# Patient Record
Sex: Male | Born: 1980 | Race: White | Hispanic: No | Marital: Single | Smoking: Current every day smoker
Health system: Southern US, Community
[De-identification: ages and names within clinical notes are randomized; demographics above are authoritative.]

## PROBLEM LIST (undated history)

## (undated) DIAGNOSIS — E0590 Thyrotoxicosis, unspecified without thyrotoxic crisis or storm: Secondary | ICD-10-CM

## (undated) DIAGNOSIS — J189 Pneumonia, unspecified organism: Secondary | ICD-10-CM

## (undated) DIAGNOSIS — M2550 Pain in unspecified joint: Secondary | ICD-10-CM

## (undated) DIAGNOSIS — N179 Acute kidney failure, unspecified: Secondary | ICD-10-CM

## (undated) DIAGNOSIS — Z8614 Personal history of Methicillin resistant Staphylococcus aureus infection: Secondary | ICD-10-CM

## (undated) DIAGNOSIS — Z8709 Personal history of other diseases of the respiratory system: Secondary | ICD-10-CM

## (undated) DIAGNOSIS — G8929 Other chronic pain: Secondary | ICD-10-CM

## (undated) DIAGNOSIS — M549 Dorsalgia, unspecified: Secondary | ICD-10-CM

## (undated) HISTORY — PX: OTHER SURGICAL HISTORY: SHX169

---

## 2002-08-25 DIAGNOSIS — J189 Pneumonia, unspecified organism: Secondary | ICD-10-CM

## 2002-08-25 HISTORY — DX: Pneumonia, unspecified organism: J18.9

## 2006-02-18 ENCOUNTER — Emergency Department (HOSPITAL_COMMUNITY): Admission: EM | Admit: 2006-02-18 | Discharge: 2006-02-18 | Payer: Self-pay | Admitting: Family Medicine

## 2006-07-14 ENCOUNTER — Emergency Department (HOSPITAL_COMMUNITY): Admission: EM | Admit: 2006-07-14 | Discharge: 2006-07-14 | Payer: Self-pay | Admitting: Emergency Medicine

## 2008-01-01 IMAGING — CR DG FOREARM 2V*L*
2 series · 2 of 2 positions shown · non-contrast
Comparison: none

CLINICAL DATA: Fall

LEFT FOREARM - 2 VIEW

[view not recorded (1 of 2)]
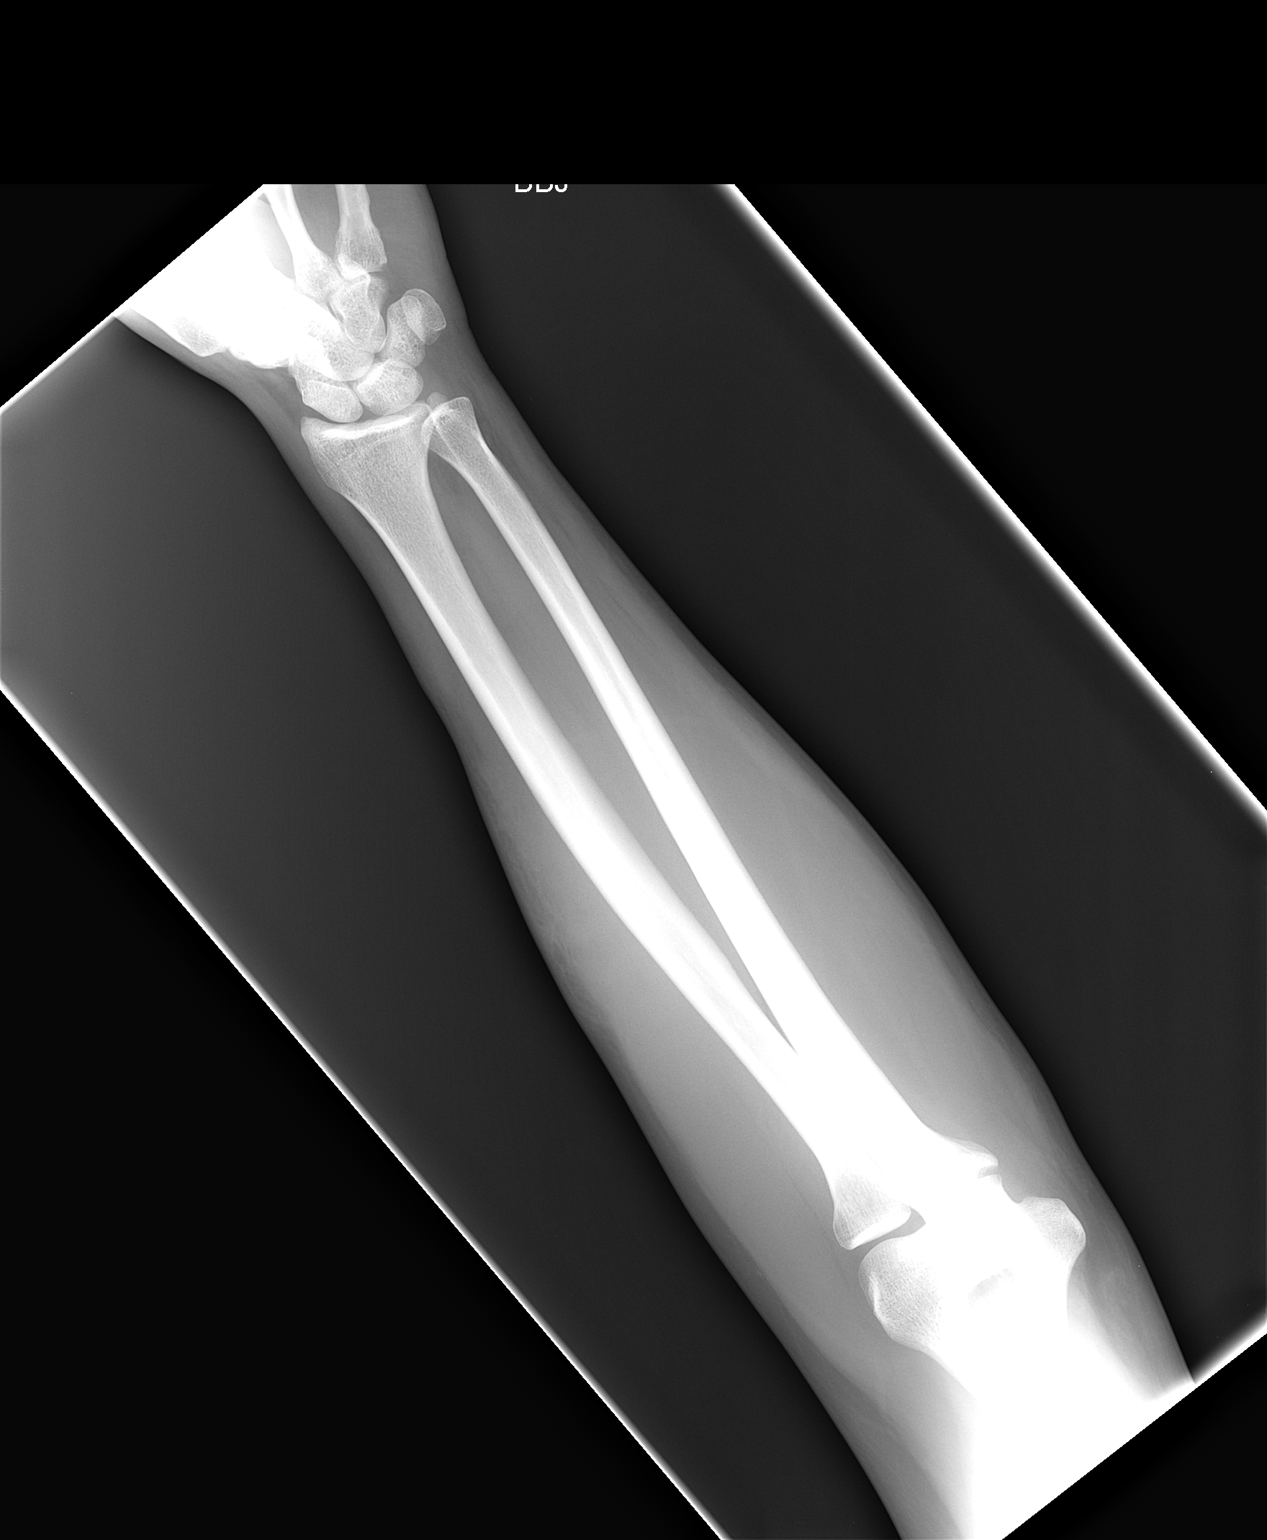

[view not recorded (2 of 2)]
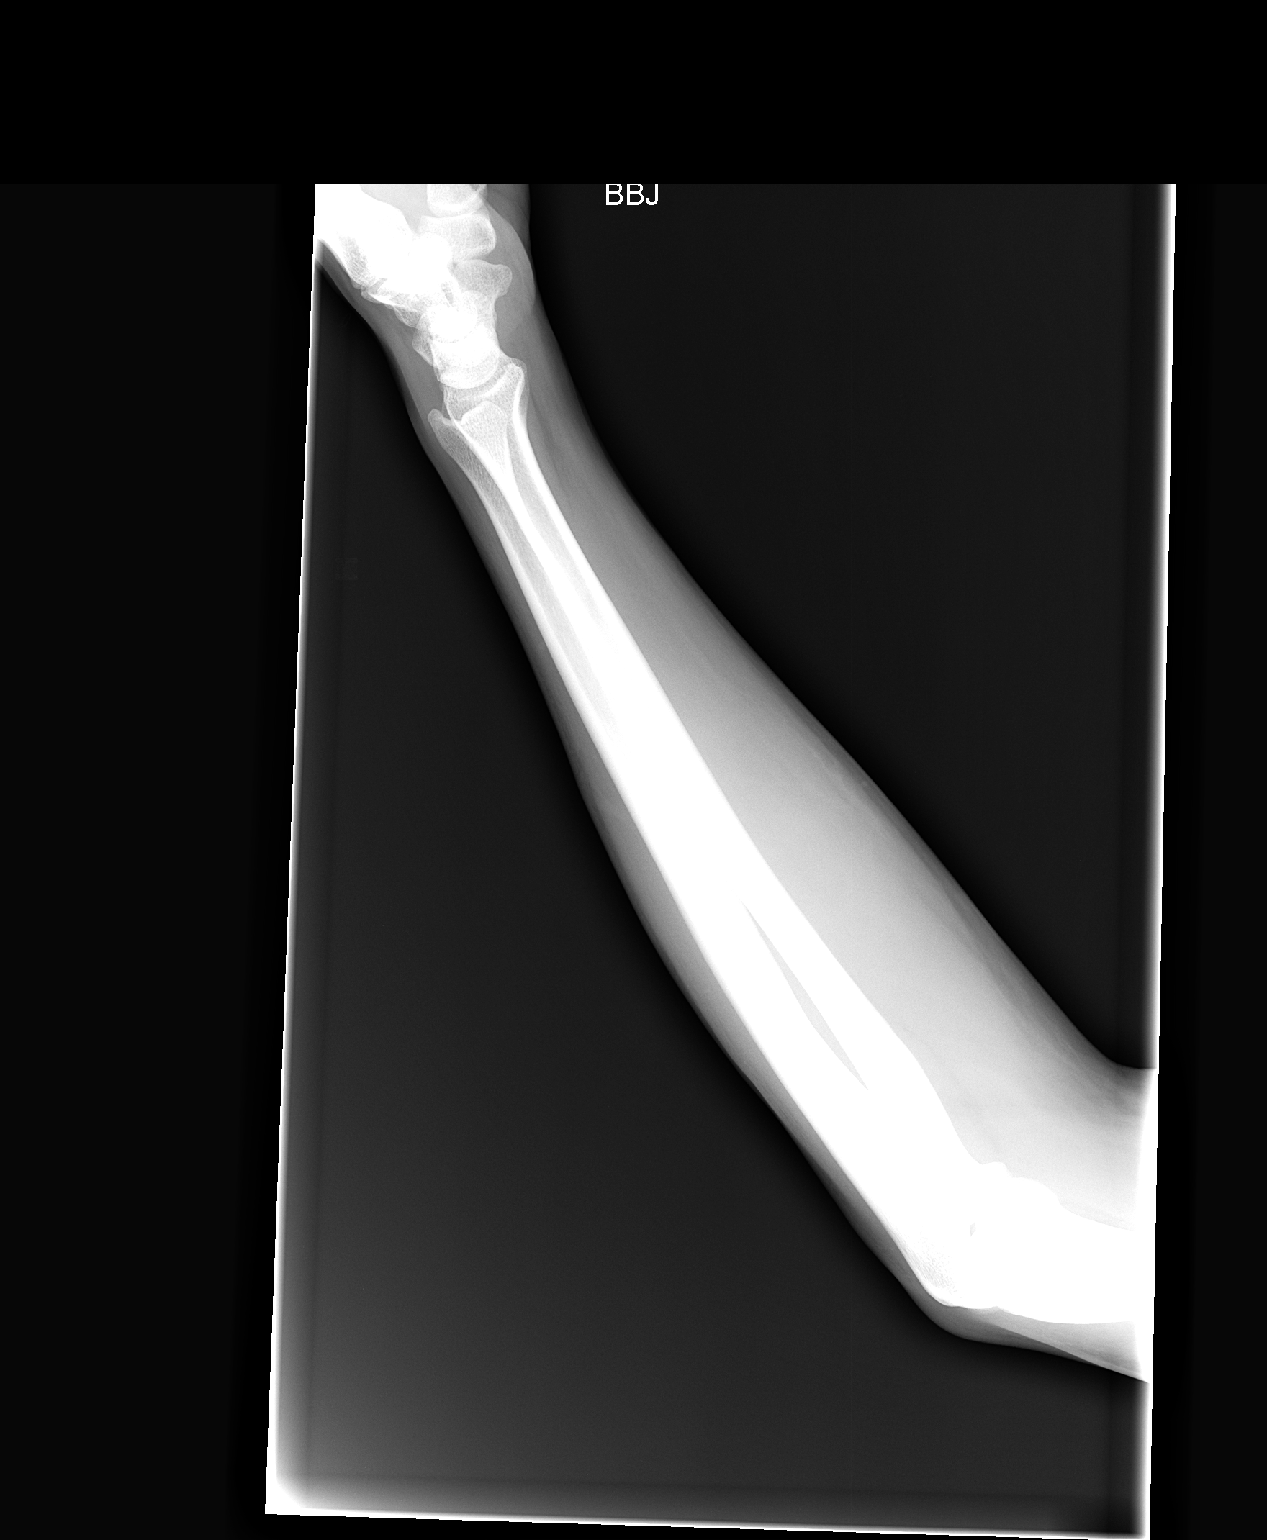

[2 of 2 positions shown; findings below may reference images not displayed]

FINDINGS: No visible fracture or acute bony findings. No foreign body
identified.

IMPRESSION

No acute bony findings.

## 2008-01-01 IMAGING — CR DG RIBS 2V*R*
3 series · 3 of 3 positions shown · non-contrast
Comparison: none

CLINICAL DATA: Right rib injury with fall.

RIGHT RIBS - 3 VIEW

[view not recorded (1 of 3)]
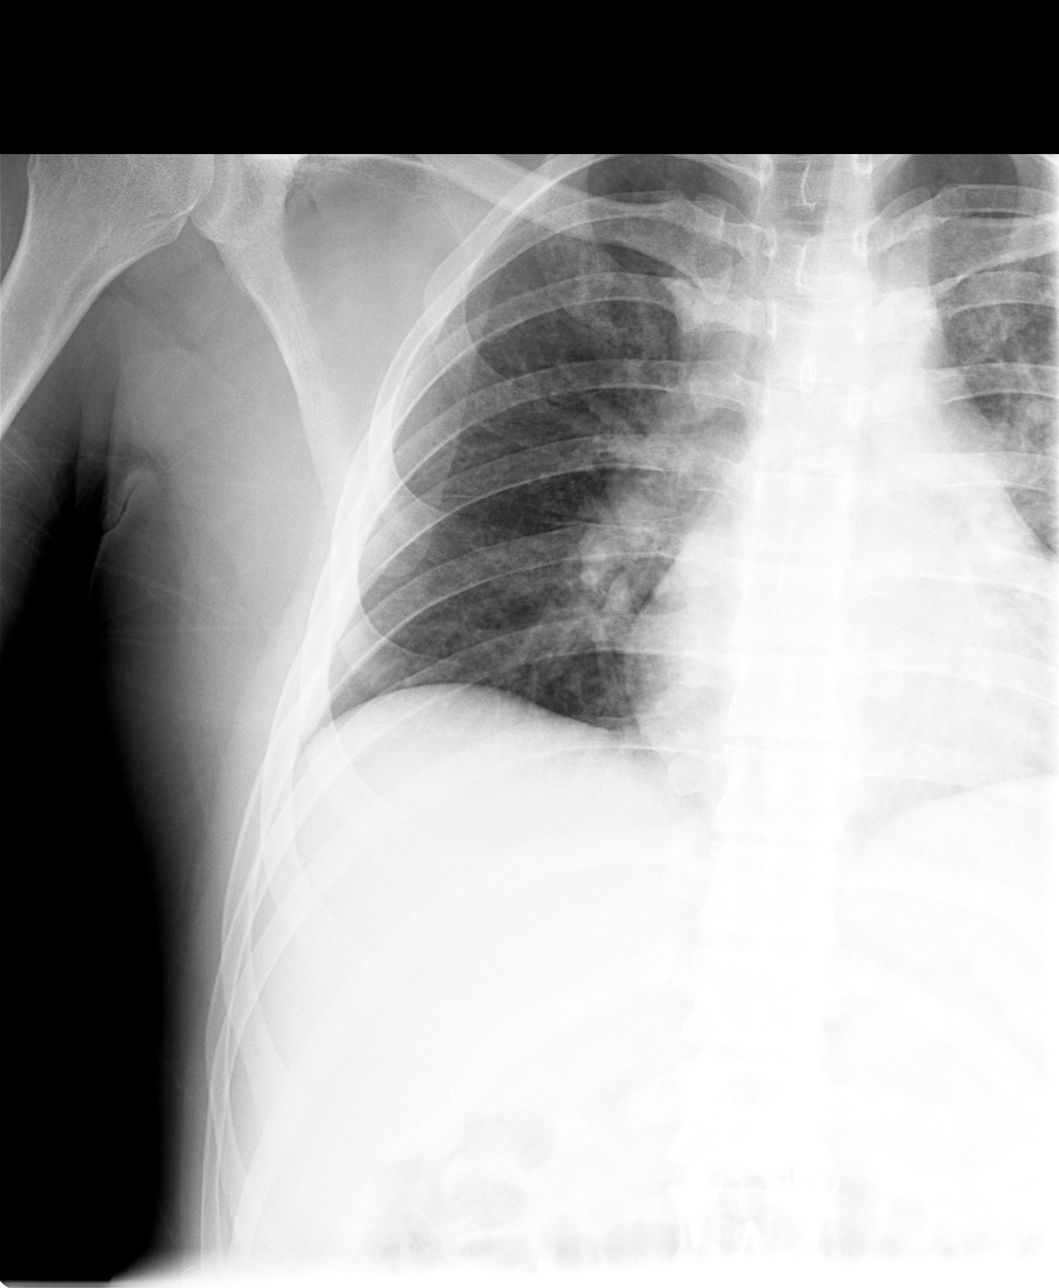

[view not recorded (2 of 3)]
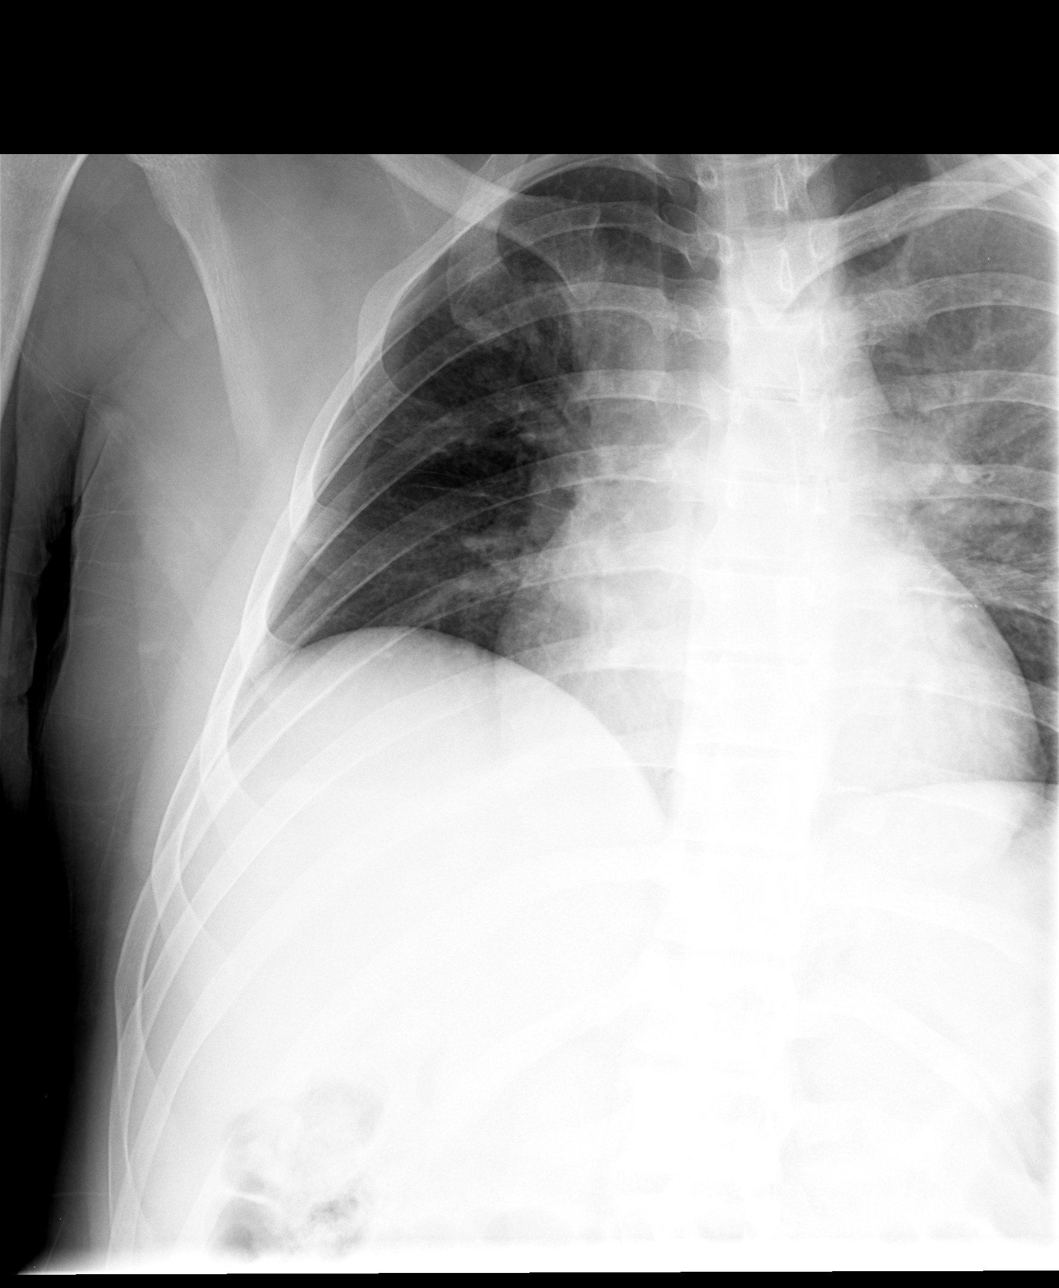

[view not recorded (3 of 3)]
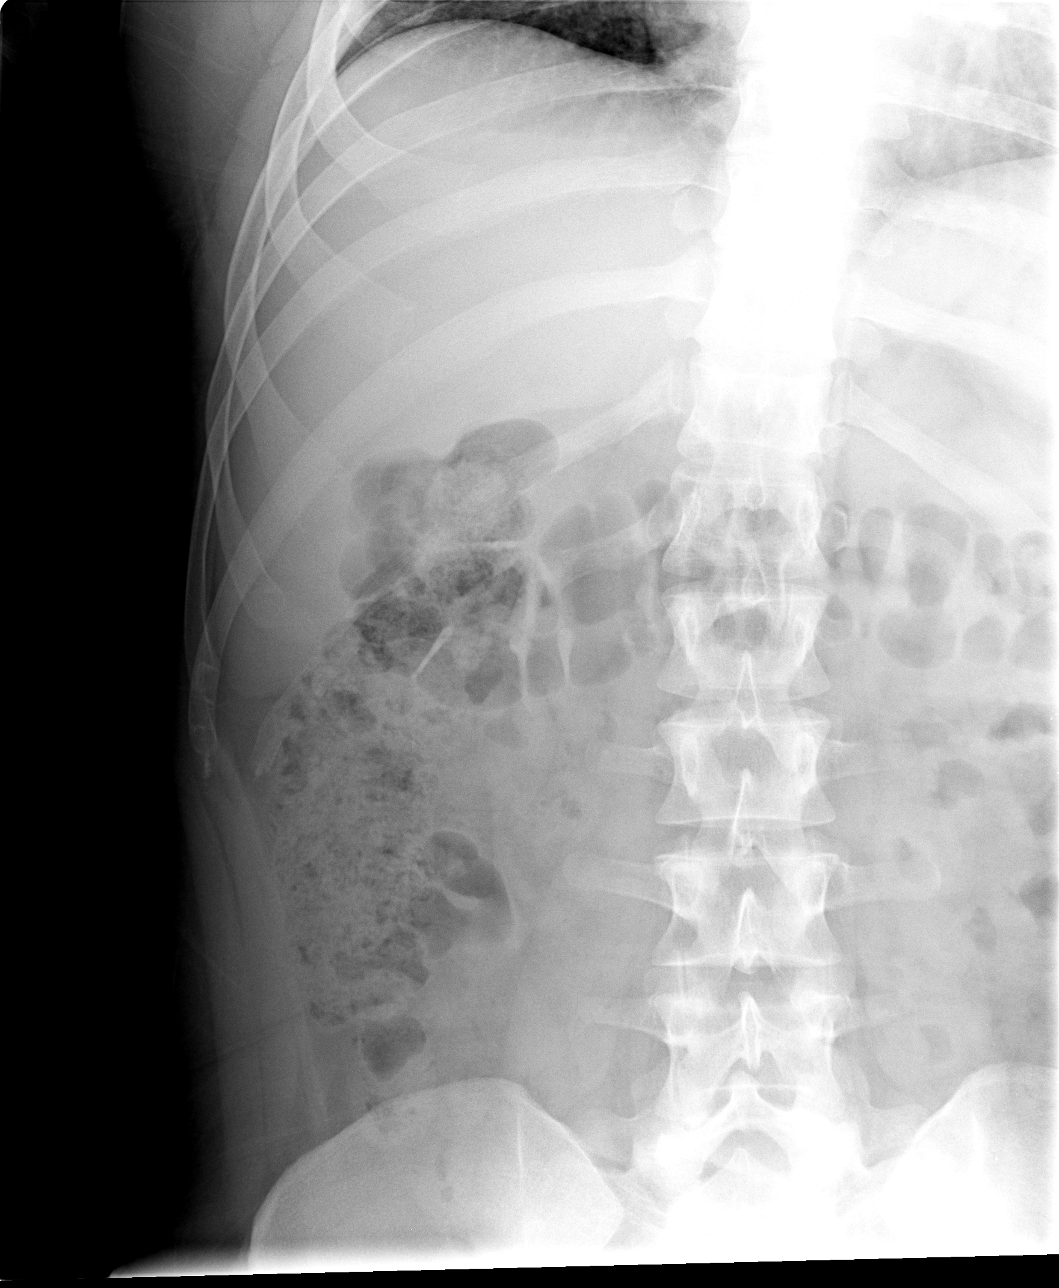

[3 of 3 positions shown; findings below may reference images not displayed]

FINDINGS: Three views of the right ribs did not include a diagnostic radiograph
of the chest. Number of images recorded on the clinical exam note is 4, but I
telephoned Ms. Razak, the technologist, and confirmed that only 3 images were
performed.

No right-sided pneumothorax is identified. No blunting of the right costophrenic
angle is identified. The left lung was not included on imaging on today's exam.
No definite rib fracture is identified.

IMPRESSION

1. No right rib fractures identified. Please note that nondisplaced rib
fractures can be occult on conventional radiography.

## 2011-08-26 DIAGNOSIS — Z8709 Personal history of other diseases of the respiratory system: Secondary | ICD-10-CM

## 2011-08-26 HISTORY — DX: Personal history of other diseases of the respiratory system: Z87.09

## 2015-01-24 DIAGNOSIS — N179 Acute kidney failure, unspecified: Secondary | ICD-10-CM

## 2015-01-24 HISTORY — DX: Acute kidney failure, unspecified: N17.9

## 2015-02-23 DIAGNOSIS — Z8614 Personal history of Methicillin resistant Staphylococcus aureus infection: Secondary | ICD-10-CM

## 2015-02-23 HISTORY — DX: Personal history of Methicillin resistant Staphylococcus aureus infection: Z86.14

## 2015-10-10 ENCOUNTER — Encounter (HOSPITAL_COMMUNITY): Payer: Self-pay | Admitting: *Deleted

## 2015-10-10 ENCOUNTER — Emergency Department (HOSPITAL_COMMUNITY)
Admission: EM | Admit: 2015-10-10 | Discharge: 2015-10-10 | Disposition: A | Discharge status: Home / Self Care | Payer: Medicare HMO | Attending: Emergency Medicine | Admitting: Emergency Medicine

## 2015-10-10 DIAGNOSIS — Z8614 Personal history of Methicillin resistant Staphylococcus aureus infection: Secondary | ICD-10-CM | POA: Diagnosis not present

## 2015-10-10 DIAGNOSIS — L03115 Cellulitis of right lower limb: Secondary | ICD-10-CM | POA: Diagnosis not present

## 2015-10-10 DIAGNOSIS — Z87891 Personal history of nicotine dependence: Secondary | ICD-10-CM | POA: Insufficient documentation

## 2015-10-10 DIAGNOSIS — M79604 Pain in right leg: Secondary | ICD-10-CM | POA: Diagnosis present

## 2015-10-10 MED ORDER — DOXYCYCLINE HYCLATE 100 MG PO TABS: 100.0000 mg | ORAL_TABLET | Freq: Two times a day (BID) | ORAL | Status: AC

## 2015-10-10 MED ORDER — DOXYCYCLINE HYCLATE 100 MG PO TABS
100.0000 mg | ORAL_TABLET | Freq: Two times a day (BID) | ORAL | Status: DC
  Administered 2015-10-10: 100 mg via ORAL
  Filled 2015-10-10: qty 1

## 2015-10-10 NOTE — ED Notes (Signed)
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

## 2015-10-10 NOTE — ED Provider Notes (Signed)
CSN: [id number]     Arrival date & time 10/10/15  0215 History   First MD Initiated Contact with Patient 10/10/15 0258     Chief Complaint  Patient presents with  . Abscess     (Consider location/radiation/quality/duration/timing/severity/associated sxs/prior Treatment) HPI  This a 35 year old male with a history of MRSA skin infections who presents with right lower leg pain and redness. Patient reports onset of symptoms yesterday. States he woke up yesterday morning with pain to the right calf. He noted swelling. He states that this feels like he had prior MRSA infections. He has required I and D over multiple sites of his body in the past.  Denies fever or systemic illness.  History reviewed. No pertinent past medical history. History reviewed. No pertinent past surgical history. History reviewed. No pertinent family history. Social History  Substance Use Topics  . Smoking status: Former Games developer  . Smokeless tobacco: None  . Alcohol Use: No    Review of Systems  Constitutional: Negative for fever.  Musculoskeletal:       Right leg pain  Skin: Positive for color change and pallor.  All other systems reviewed and are negative.     Allergies  Review of patient's allergies indicates no known allergies.  Home Medications   Prior to Admission medications   Medication Sig Start Date End Date Taking? Authorizing Provider  doxycycline (VIBRA-TABS) 100 MG tablet Take 1 tablet (100 mg total) by mouth 2 (two) times daily. 10/10/15   Shon Baton, MD   BP 133/94 mmHg  Pulse 80  Temp(Src) 98.4 F (36.9 C) (Oral)  Resp 18  Ht 6' (1.829 m)  Wt 175 lb (79.379 kg)  BMI 23.73 kg/m2  SpO2 100% Physical Exam  Constitutional: He is oriented to person, place, and time. He appears well-developed and well-nourished. No distress.  HENT:  Head: Normocephalic and atraumatic.  Cardiovascular: Normal rate and regular rhythm.   Pulmonary/Chest: Effort normal. No respiratory distress.   Abdominal: Soft. There is no tenderness.  Musculoskeletal: He exhibits no edema.  Neurological: He is alert and oriented to person, place, and time.  Skin: Skin is warm and dry.  Small excoriation noted over the right lateral calf with adjacent erythema extending circumferentially, blanching, no fluctuance or induration noted, mild warmth to touch, no swelling noted  Psychiatric: He has a normal mood and affect.  Nursing note and vitals reviewed.   ED Course  Procedures (including critical care time) Labs Review Labs Reviewed - No data to display  Imaging Review No results found. I have personally reviewed and evaluated these images and lab results as part of my medical decision-making.   EKG Interpretation None      MDM   Final diagnoses:  Cellulitis of right lower extremity    Patient presents with what appears to be early cellulitis of the right lower extremity. No obvious abscess at this time. He is systemically well-appearing and denies fevers. Vital signs are reassuring. Discussed with patient starting antibiotics. He was given instructions regarding signs and symptoms of worsening infection or abscess. He stated understanding. He will be discharged with doxycycline.  After history, exam, and medical workup I feel the patient has been appropriately medically screened and is safe for discharge home. Pertinent diagnoses were discussed with the patient. Patient was given return precautions.     Shon Baton, MD 10/10/15 [phone]

## 2015-10-10 NOTE — Discharge Instructions (Signed)

## 2015-12-28 ENCOUNTER — Emergency Department (HOSPITAL_COMMUNITY)
Admission: EM | Admit: 2015-12-28 | Discharge: 2015-12-28 | Disposition: A | Discharge status: Home / Self Care | Payer: Medicare HMO | Attending: Emergency Medicine | Admitting: Emergency Medicine

## 2015-12-28 ENCOUNTER — Emergency Department (HOSPITAL_COMMUNITY): Payer: Medicare HMO

## 2015-12-28 ENCOUNTER — Encounter (HOSPITAL_COMMUNITY): Payer: Self-pay

## 2015-12-28 DIAGNOSIS — Y9389 Activity, other specified: Secondary | ICD-10-CM | POA: Insufficient documentation

## 2015-12-28 DIAGNOSIS — S82891A Other fracture of right lower leg, initial encounter for closed fracture: Secondary | ICD-10-CM

## 2015-12-28 DIAGNOSIS — X58XXXA Exposure to other specified factors, initial encounter: Secondary | ICD-10-CM | POA: Diagnosis not present

## 2015-12-28 DIAGNOSIS — Y9289 Other specified places as the place of occurrence of the external cause: Secondary | ICD-10-CM | POA: Diagnosis not present

## 2015-12-28 DIAGNOSIS — R52 Pain, unspecified: Secondary | ICD-10-CM

## 2015-12-28 DIAGNOSIS — Z792 Long term (current) use of antibiotics: Secondary | ICD-10-CM | POA: Diagnosis not present

## 2015-12-28 DIAGNOSIS — F17200 Nicotine dependence, unspecified, uncomplicated: Secondary | ICD-10-CM | POA: Diagnosis not present

## 2015-12-28 DIAGNOSIS — Y998 Other external cause status: Secondary | ICD-10-CM | POA: Diagnosis not present

## 2015-12-28 DIAGNOSIS — S82851A Displaced trimalleolar fracture of right lower leg, initial encounter for closed fracture: Secondary | ICD-10-CM | POA: Insufficient documentation

## 2015-12-28 DIAGNOSIS — S99911A Unspecified injury of right ankle, initial encounter: Secondary | ICD-10-CM | POA: Diagnosis present

## 2015-12-28 DIAGNOSIS — T148XXA Other injury of unspecified body region, initial encounter: Secondary | ICD-10-CM

## 2015-12-28 MED ORDER — DIAZEPAM 5 MG PO TABS
5.0000 mg | ORAL_TABLET | Freq: Once | ORAL | Status: AC
  Administered 2015-12-28: 5 mg via ORAL
  Filled 2015-12-28: qty 1

## 2015-12-28 MED ORDER — OXYCODONE-ACETAMINOPHEN 5-325 MG PO TABS
2.0000 | ORAL_TABLET | Freq: Once | ORAL | Status: AC
  Administered 2015-12-28: 2 via ORAL
  Filled 2015-12-28: qty 2

## 2015-12-28 MED ORDER — METHOCARBAMOL 750 MG PO TABS: 750.0000 mg | ORAL_TABLET | Freq: Four times a day (QID) | ORAL | Status: AC

## 2015-12-28 MED ORDER — SODIUM CHLORIDE 0.9 % IV SOLN: INTRAVENOUS | Status: DC

## 2015-12-28 MED ORDER — HYDROMORPHONE HCL 1 MG/ML IJ SOLN
1.0000 mg | Freq: Once | INTRAMUSCULAR | Status: DC
Start: 2015-12-28 — End: 2015-12-28
  Filled 2015-12-28: qty 1

## 2015-12-28 MED ORDER — OXYCODONE-ACETAMINOPHEN 5-325 MG PO TABS: 1.0000 | ORAL_TABLET | ORAL | Status: AC | PRN

## 2015-12-28 MED ORDER — MUPIROCIN 2 % EX OINT: 1.0000 "application " | TOPICAL_OINTMENT | Freq: Two times a day (BID) | CUTANEOUS | Status: DC

## 2015-12-28 MED ORDER — HYDROMORPHONE HCL 1 MG/ML IJ SOLN
1.0000 mg | Freq: Once | INTRAMUSCULAR | Status: AC
Start: 2015-12-28 — End: 2015-12-28
  Administered 2015-12-28: 1 mg via INTRAVENOUS

## 2015-12-28 NOTE — Discharge Instructions (Signed)
°Cast or Splint Care  ° ° °Casts and splints support injured limbs and keep bones from moving while they heal. It is important to care for your cast or splint at home.  °HOME CARE INSTRUCTIONS  °Keep the cast or splint uncovered during the drying period. It can take 24 to 48 hours to dry if it is made of plaster. A fiberglass cast will dry in less than 1 hour.  °Do not rest the cast on anything harder than a pillow for the first 24 hours.  °Do not put weight on your injured limb or apply pressure to the cast until your health care provider gives you permission.  °Keep the cast or splint dry. Wet casts or splints can lose their shape and may not support the limb as well. A wet cast that has lost its shape can also create harmful pressure on your skin when it dries. Also, wet skin can become infected.  °Cover the cast or splint with a plastic bag when bathing or when out in the rain or snow. If the cast is on the trunk of the body, take sponge baths until the cast is removed.  °If your cast does become wet, dry it with a towel or a blow dryer on the cool setting only. °Keep your cast or splint clean. Soiled casts may be wiped with a moistened cloth.  °Do not place any hard or soft foreign objects under your cast or splint, such as cotton, toilet paper, lotion, or powder.  °Do not try to scratch the skin under the cast with any object. The object could get stuck inside the cast. Also, scratching could lead to an infection. If itching is a problem, use a blow dryer on a cool setting to relieve discomfort.  °Do not trim or cut your cast or remove padding from inside of it.  °Exercise all joints next to the injury that are not immobilized by the cast or splint. For example, if you have a long leg cast, exercise the hip joint and toes. If you have an arm cast or splint, exercise the shoulder, elbow, thumb, and fingers.  °Elevate your injured arm or leg on 1 or 2 pillows for the first 1 to 3 days to decrease swelling and  pain. It is best if you can comfortably elevate your cast so it is higher than your heart. °SEEK MEDICAL CARE IF:  °Your cast or splint cracks.  °Your cast or splint is too tight or too loose.  °You have unbearable itching inside the cast.  °Your cast becomes wet or develops a soft spot or area.  °You have a bad smell coming from inside your cast.  °You get an object stuck under your cast.  °Your skin around the cast becomes red or raw.  °You have new pain or worsening pain after the cast has been applied. °SEEK IMMEDIATE MEDICAL CARE IF:  °You have fluid leaking through the cast.  °You are unable to move your fingers or toes.  °You have discolored (blue or white), cool, painful, or very swollen fingers or toes beyond the cast.  °You have tingling or numbness around the injured area.  °You have severe pain or pressure under the cast.  °You have any difficulty with your breathing or have shortness of breath.  °You have chest pain. °This information is not intended to replace advice given to you by your health care provider. Make sure you discuss any questions you have with your health care provider.  °  Document Released: 08/08/2000 Document Revised: 06/01/2013 Document Reviewed: 02/17/2013  °Elsevier Interactive Patient Education ©2016 [employer]vier Inc.  ° °
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
## 2015-12-28 NOTE — Progress Notes (Signed)
Patient listed as Having [employer] insurance without a pcp.  Patient lives in [place].  [facility] with list of pcps who accept [employer] insurance within a 15 mile radius of patient's zip code [phone].  Patient thankful for resources.  No further EDCM needs at this time.
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
## 2016-01-01 ENCOUNTER — Encounter (HOSPITAL_COMMUNITY): Payer: Self-pay | Admitting: *Deleted

## 2016-01-01 ENCOUNTER — Other Ambulatory Visit: Payer: Self-pay | Admitting: Orthopaedic Surgery

## 2016-01-01 NOTE — Progress Notes (Signed)
I called patient's number to tell him that arrival time has been moved up , no answer, no voice mail.

## 2016-01-01 NOTE — Progress Notes (Signed)
Pt has been using Mupirocin for a while.

## 2016-01-01 NOTE — Progress Notes (Signed)
I spoke with patient and informed him of new arrival time of 1230.

## 2016-01-02 ENCOUNTER — Ambulatory Visit (HOSPITAL_COMMUNITY): Payer: Medicare HMO | Admitting: Certified Registered Nurse Anesthetist

## 2016-01-02 ENCOUNTER — Observation Stay (HOSPITAL_COMMUNITY)
Admission: RE | Admit: 2016-01-02 | Discharge: 2016-01-03 | Disposition: A | Discharge status: Home Health | Payer: Medicare HMO | Source: Ambulatory Visit | Attending: Orthopaedic Surgery | Admitting: Orthopaedic Surgery

## 2016-01-02 ENCOUNTER — Encounter (HOSPITAL_COMMUNITY): Payer: Self-pay | Admitting: *Deleted

## 2016-01-02 ENCOUNTER — Ambulatory Visit (HOSPITAL_COMMUNITY): Payer: Medicare HMO

## 2016-01-02 ENCOUNTER — Encounter (HOSPITAL_COMMUNITY)
Admission: RE | Disposition: A | Discharge status: Home Health | Payer: Self-pay | Source: Ambulatory Visit | Attending: Orthopaedic Surgery

## 2016-01-02 DIAGNOSIS — S82851A Displaced trimalleolar fracture of right lower leg, initial encounter for closed fracture: Secondary | ICD-10-CM | POA: Diagnosis not present

## 2016-01-02 DIAGNOSIS — F17200 Nicotine dependence, unspecified, uncomplicated: Secondary | ICD-10-CM | POA: Diagnosis not present

## 2016-01-02 DIAGNOSIS — Z8781 Personal history of (healed) traumatic fracture: Secondary | ICD-10-CM

## 2016-01-02 DIAGNOSIS — X58XXXA Exposure to other specified factors, initial encounter: Secondary | ICD-10-CM | POA: Insufficient documentation

## 2016-01-02 DIAGNOSIS — Z9889 Other specified postprocedural states: Secondary | ICD-10-CM

## 2016-01-02 DIAGNOSIS — Z419 Encounter for procedure for purposes other than remedying health state, unspecified: Secondary | ICD-10-CM

## 2016-01-02 DIAGNOSIS — Z8614 Personal history of Methicillin resistant Staphylococcus aureus infection: Secondary | ICD-10-CM | POA: Insufficient documentation

## 2016-01-02 HISTORY — DX: Thyrotoxicosis, unspecified without thyrotoxic crisis or storm: E05.90

## 2016-01-02 HISTORY — PX: ORIF ANKLE FRACTURE: SHX5408

## 2016-01-02 HISTORY — DX: Other chronic pain: G89.29

## 2016-01-02 HISTORY — DX: Pain in unspecified joint: M25.50

## 2016-01-02 HISTORY — DX: Personal history of Methicillin resistant Staphylococcus aureus infection: Z86.14

## 2016-01-02 HISTORY — DX: Dorsalgia, unspecified: M54.9

## 2016-01-02 HISTORY — DX: Pneumonia, unspecified organism: J18.9

## 2016-01-02 HISTORY — DX: Personal history of other diseases of the respiratory system: Z87.09

## 2016-01-02 HISTORY — DX: Acute kidney failure, unspecified: N17.9

## 2016-01-02 LAB — CBC
HEMATOCRIT: 40.9 % (ref 39.0–52.0)
HEMOGLOBIN: 14.3 g/dL (ref 13.0–17.0)
MCH: 31.6 pg (ref 26.0–34.0)
MCHC: 35 g/dL (ref 30.0–36.0)
MCV: 90.3 fL (ref 78.0–100.0)
Platelets: 285 10*3/uL (ref 150–400)
RBC: 4.53 MIL/uL (ref 4.22–5.81)
RDW: 13 % (ref 11.5–15.5)
WBC: 6.9 10*3/uL (ref 4.0–10.5)

## 2016-01-02 SURGERY — OPEN REDUCTION INTERNAL FIXATION (ORIF) ANKLE FRACTURE
Anesthesia: General | Laterality: Right

## 2016-01-02 MED ORDER — PHENYLEPHRINE 40 MCG/ML (10ML) SYRINGE FOR IV PUSH (FOR BLOOD PRESSURE SUPPORT)
PREFILLED_SYRINGE | INTRAVENOUS | Status: AC
  Filled 2016-01-02: qty 40

## 2016-01-02 MED ORDER — METOCLOPRAMIDE HCL 5 MG PO TABS: 5.0000 mg | ORAL_TABLET | Freq: Three times a day (TID) | ORAL | Status: DC | PRN

## 2016-01-02 MED ORDER — ONDANSETRON HCL 4 MG/2ML IJ SOLN: 4.0000 mg | Freq: Once | INTRAMUSCULAR | Status: DC | PRN

## 2016-01-02 MED ORDER — ONDANSETRON HCL 4 MG PO TABS: 4.0000 mg | ORAL_TABLET | Freq: Four times a day (QID) | ORAL | Status: DC | PRN

## 2016-01-02 MED ORDER — MIDAZOLAM HCL 2 MG/2ML IJ SOLN
INTRAMUSCULAR | Status: AC
  Administered 2016-01-02: 2 mg
  Filled 2016-01-02: qty 2

## 2016-01-02 MED ORDER — LIDOCAINE HCL (CARDIAC) 20 MG/ML IV SOLN
INTRAVENOUS | Status: DC | PRN
  Administered 2016-01-02: 60 mg via INTRAVENOUS

## 2016-01-02 MED ORDER — EPHEDRINE 5 MG/ML INJ
INTRAVENOUS | Status: AC
  Filled 2016-01-02: qty 20

## 2016-01-02 MED ORDER — KETOROLAC TROMETHAMINE 30 MG/ML IJ SOLN
INTRAMUSCULAR | Status: AC
  Administered 2016-01-02: 30 mg via INTRAVENOUS
  Filled 2016-01-02: qty 1

## 2016-01-02 MED ORDER — MORPHINE SULFATE (PF) 2 MG/ML IV SOLN
1.0000 mg | INTRAVENOUS | Status: DC | PRN
  Administered 2016-01-03: 1 mg via INTRAVENOUS
  Filled 2016-01-02: qty 1

## 2016-01-02 MED ORDER — OXYCODONE HCL 5 MG/5ML PO SOLN: 5.0000 mg | Freq: Once | ORAL | Status: DC | PRN

## 2016-01-02 MED ORDER — KETOROLAC TROMETHAMINE 30 MG/ML IJ SOLN
30.0000 mg | Freq: Four times a day (QID) | INTRAMUSCULAR | Status: DC | PRN
  Administered 2016-01-02 – 2016-01-03 (×2): 30 mg via INTRAVENOUS
  Filled 2016-01-02 (×4): qty 1

## 2016-01-02 MED ORDER — PROPOFOL 10 MG/ML IV BOLUS
INTRAVENOUS | Status: AC
  Filled 2016-01-02: qty 20

## 2016-01-02 MED ORDER — FENTANYL CITRATE (PF) 250 MCG/5ML IJ SOLN
INTRAMUSCULAR | Status: AC
  Filled 2016-01-02: qty 5

## 2016-01-02 MED ORDER — FENTANYL CITRATE (PF) 100 MCG/2ML IJ SOLN
INTRAMUSCULAR | Status: AC
  Administered 2016-01-02: 50 ug via INTRAVENOUS
  Filled 2016-01-02: qty 2

## 2016-01-02 MED ORDER — DIPHENHYDRAMINE HCL 12.5 MG/5ML PO ELIX: 25.0000 mg | ORAL_SOLUTION | ORAL | Status: DC | PRN

## 2016-01-02 MED ORDER — CEFAZOLIN SODIUM 1 G IJ SOLR
INTRAMUSCULAR | Status: AC
  Filled 2016-01-02: qty 20

## 2016-01-02 MED ORDER — METHOCARBAMOL 500 MG PO TABS
500.0000 mg | ORAL_TABLET | Freq: Four times a day (QID) | ORAL | Status: DC | PRN
  Administered 2016-01-03 (×2): 500 mg via ORAL
  Filled 2016-01-02 (×3): qty 1

## 2016-01-02 MED ORDER — FENTANYL CITRATE (PF) 100 MCG/2ML IJ SOLN
25.0000 ug | INTRAMUSCULAR | Status: DC | PRN
  Administered 2016-01-02: 50 ug via INTRAVENOUS

## 2016-01-02 MED ORDER — FENTANYL CITRATE (PF) 100 MCG/2ML IJ SOLN
INTRAMUSCULAR | Status: DC | PRN
  Administered 2016-01-02: 100 ug via INTRAVENOUS
  Administered 2016-01-02 (×8): 50 ug via INTRAVENOUS

## 2016-01-02 MED ORDER — FENTANYL CITRATE (PF) 100 MCG/2ML IJ SOLN
INTRAMUSCULAR | Status: AC
  Administered 2016-01-02: 100 ug
  Filled 2016-01-02: qty 2

## 2016-01-02 MED ORDER — OXYCODONE HCL 5 MG PO TABS
5.0000 mg | ORAL_TABLET | ORAL | Status: DC | PRN
  Administered 2016-01-03: 15 mg via ORAL
  Administered 2016-01-03: 10 mg via ORAL
  Filled 2016-01-02: qty 2
  Filled 2016-01-02 (×2): qty 3
  Filled 2016-01-02: qty 2

## 2016-01-02 MED ORDER — CEFAZOLIN SODIUM-DEXTROSE 2-3 GM-% IV SOLR
INTRAVENOUS | Status: DC | PRN
  Administered 2016-01-02: 2 g via INTRAVENOUS

## 2016-01-02 MED ORDER — MUPIROCIN 2 % EX OINT
TOPICAL_OINTMENT | CUTANEOUS | Status: AC
  Filled 2016-01-02: qty 22

## 2016-01-02 MED ORDER — METOCLOPRAMIDE HCL 5 MG/ML IJ SOLN: 5.0000 mg | Freq: Three times a day (TID) | INTRAMUSCULAR | Status: DC | PRN

## 2016-01-02 MED ORDER — SORBITOL 70 % SOLN: 30.0000 mL | Freq: Every day | Status: DC | PRN

## 2016-01-02 MED ORDER — LIDOCAINE 2% (20 MG/ML) 5 ML SYRINGE
INTRAMUSCULAR | Status: AC
  Filled 2016-01-02: qty 10

## 2016-01-02 MED ORDER — MAGNESIUM CITRATE PO SOLN: 1.0000 | Freq: Once | ORAL | Status: DC | PRN

## 2016-01-02 MED ORDER — VANCOMYCIN HCL IN DEXTROSE 1-5 GM/200ML-% IV SOLN
INTRAVENOUS | Status: AC
  Filled 2016-01-02: qty 200

## 2016-01-02 MED ORDER — VANCOMYCIN HCL IN DEXTROSE 1-5 GM/200ML-% IV SOLN
1000.0000 mg | INTRAVENOUS | Status: AC
  Administered 2016-01-02: 1000 mg via INTRAVENOUS
  Filled 2016-01-02: qty 200

## 2016-01-02 MED ORDER — MUPIROCIN 2 % EX OINT: 1.0000 "application " | TOPICAL_OINTMENT | Freq: Once | CUTANEOUS | Status: DC

## 2016-01-02 MED ORDER — PHENYLEPHRINE HCL 10 MG/ML IJ SOLN
INTRAMUSCULAR | Status: DC | PRN
  Administered 2016-01-02: 120 ug via INTRAVENOUS
  Administered 2016-01-02 (×2): 80 ug via INTRAVENOUS
  Administered 2016-01-02 (×3): 120 ug via INTRAVENOUS
  Administered 2016-01-02: 80 ug via INTRAVENOUS

## 2016-01-02 MED ORDER — VANCOMYCIN HCL IN DEXTROSE 1-5 GM/200ML-% IV SOLN
1000.0000 mg | Freq: Two times a day (BID) | INTRAVENOUS | Status: AC
  Administered 2016-01-03: 1000 mg via INTRAVENOUS
  Filled 2016-01-02: qty 200

## 2016-01-02 MED ORDER — DEXMEDETOMIDINE HCL IN NACL 200 MCG/50ML IV SOLN
INTRAVENOUS | Status: AC
  Filled 2016-01-02: qty 50

## 2016-01-02 MED ORDER — ACETAMINOPHEN 650 MG RE SUPP: 650.0000 mg | Freq: Four times a day (QID) | RECTAL | Status: DC | PRN

## 2016-01-02 MED ORDER — LACTATED RINGERS IV SOLN
INTRAVENOUS | Status: DC
  Administered 2016-01-02 (×2): via INTRAVENOUS

## 2016-01-02 MED ORDER — PROPOFOL 10 MG/ML IV BOLUS
INTRAVENOUS | Status: DC | PRN
  Administered 2016-01-02: 200 mg via INTRAVENOUS
  Administered 2016-01-02: 50 mg via INTRAVENOUS

## 2016-01-02 MED ORDER — DEXTROSE 5 % IV SOLN
500.0000 mg | Freq: Four times a day (QID) | INTRAVENOUS | Status: DC | PRN
  Administered 2016-01-02: 500 mg via INTRAVENOUS
  Filled 2016-01-02 (×3): qty 5

## 2016-01-02 MED ORDER — 0.9 % SODIUM CHLORIDE (POUR BTL) OPTIME
TOPICAL | Status: DC | PRN
  Administered 2016-01-02 (×4): 1000 mL

## 2016-01-02 MED ORDER — POLYETHYLENE GLYCOL 3350 17 G PO PACK: 17.0000 g | PACK | Freq: Every day | ORAL | Status: DC | PRN

## 2016-01-02 MED ORDER — ONDANSETRON HCL 4 MG/2ML IJ SOLN
INTRAMUSCULAR | Status: DC | PRN
  Administered 2016-01-02: 4 mg via INTRAVENOUS

## 2016-01-02 MED ORDER — ACETAMINOPHEN 325 MG PO TABS: 650.0000 mg | ORAL_TABLET | Freq: Four times a day (QID) | ORAL | Status: DC | PRN

## 2016-01-02 MED ORDER — ONDANSETRON HCL 4 MG/2ML IJ SOLN: 4.0000 mg | Freq: Four times a day (QID) | INTRAMUSCULAR | Status: DC | PRN

## 2016-01-02 MED ORDER — ASPIRIN EC 325 MG PO TBEC
325.0000 mg | DELAYED_RELEASE_TABLET | Freq: Two times a day (BID) | ORAL | Status: DC
  Administered 2016-01-03: 325 mg via ORAL
  Filled 2016-01-02: qty 1

## 2016-01-02 MED ORDER — SODIUM CHLORIDE 0.9 % IV SOLN: INTRAVENOUS | Status: DC

## 2016-01-02 MED ORDER — ROCURONIUM BROMIDE 50 MG/5ML IV SOLN
INTRAVENOUS | Status: AC
  Filled 2016-01-02: qty 1

## 2016-01-02 MED ORDER — OXYCODONE HCL 5 MG PO TABS: 5.0000 mg | ORAL_TABLET | Freq: Once | ORAL | Status: DC | PRN

## 2016-01-02 SURGICAL SUPPLY — 76 items
BANDAGE ACE 4X5 VEL STRL LF (GAUZE/BANDAGES/DRESSINGS) ×3 IMPLANT
BANDAGE ACE 6X5 VEL STRL LF (GAUZE/BANDAGES/DRESSINGS) ×3 IMPLANT
BANDAGE ELASTIC 4 VELCRO ST LF (GAUZE/BANDAGES/DRESSINGS) IMPLANT
BANDAGE ELASTIC 6 VELCRO ST LF (GAUZE/BANDAGES/DRESSINGS) IMPLANT
BANDAGE ESMARK 6X9 LF (GAUZE/BANDAGES/DRESSINGS) IMPLANT
BLADE SURG 15 STRL LF DISP TIS (BLADE) ×1 IMPLANT
BLADE SURG 15 STRL SS (BLADE) ×2
BNDG COHESIVE 4X5 TAN STRL (GAUZE/BANDAGES/DRESSINGS) ×3 IMPLANT
BNDG COHESIVE 6X5 TAN STRL LF (GAUZE/BANDAGES/DRESSINGS) IMPLANT
BNDG ESMARK 6X9 LF (GAUZE/BANDAGES/DRESSINGS)
CANISTER SUCT 3000ML PPV (MISCELLANEOUS) ×3 IMPLANT
COVER SURGICAL LIGHT HANDLE (MISCELLANEOUS) ×3 IMPLANT
CUFF TOURNIQUET SINGLE 34IN LL (TOURNIQUET CUFF) ×3 IMPLANT
CUFF TOURNIQUET SINGLE 44IN (TOURNIQUET CUFF) IMPLANT
DRAPE C-ARM 42X72 X-RAY (DRAPES) ×3 IMPLANT
DRAPE C-ARMOR (DRAPES) ×3 IMPLANT
DRAPE IMP U-DRAPE 54X76 (DRAPES) ×3 IMPLANT
DRAPE INCISE IOBAN 66X45 STRL (DRAPES) ×6 IMPLANT
DRAPE U-SHAPE 47X51 STRL (DRAPES) ×3 IMPLANT
DURAPREP 26ML APPLICATOR (WOUND CARE) ×3 IMPLANT
ELECT CAUTERY BLADE 6.4 (BLADE) ×3 IMPLANT
ELECT REM PT RETURN 9FT ADLT (ELECTROSURGICAL) ×3
ELECTRODE REM PT RTRN 9FT ADLT (ELECTROSURGICAL) ×1 IMPLANT
FACESHIELD WRAPAROUND (MASK) ×3 IMPLANT
GAUZE SPONGE 4X4 12PLY STRL (GAUZE/BANDAGES/DRESSINGS) ×3 IMPLANT
GAUZE XEROFORM 5X9 LF (GAUZE/BANDAGES/DRESSINGS) ×3 IMPLANT
GLOVE SKINSENSE NS SZ7.5 (GLOVE) ×2
GLOVE SKINSENSE STRL SZ7.5 (GLOVE) ×1 IMPLANT
GLOVE SURG SYN 7.5  E (GLOVE) ×4
GLOVE SURG SYN 7.5 E (GLOVE) ×2 IMPLANT
GOWN STRL REIN XL XLG (GOWN DISPOSABLE) ×3 IMPLANT
GUIDEWIRE ORTH 6X062XTROC NS (WIRE) ×2 IMPLANT
K-WIRE .062 (WIRE) ×4
KIT BASIN OR (CUSTOM PROCEDURE TRAY) ×3 IMPLANT
KIT ROOM TURNOVER OR (KITS) ×3 IMPLANT
NEEDLE HYPO 25GX1X1/2 BEV (NEEDLE) IMPLANT
NS IRRIG 1000ML POUR BTL (IV SOLUTION) ×3 IMPLANT
PACK ORTHO EXTREMITY (CUSTOM PROCEDURE TRAY) ×3 IMPLANT
PAD ABD 8X10 STRL (GAUZE/BANDAGES/DRESSINGS) ×3 IMPLANT
PAD ARMBOARD 7.5X6 YLW CONV (MISCELLANEOUS) ×6 IMPLANT
PAD CAST 3X4 CTTN HI CHSV (CAST SUPPLIES) ×2 IMPLANT
PAD CAST 4YDX4 CTTN HI CHSV (CAST SUPPLIES) ×1 IMPLANT
PADDING CAST COTTON 3X4 STRL (CAST SUPPLIES) ×4
PADDING CAST COTTON 4X4 STRL (CAST SUPPLIES) ×2
PADDING CAST COTTON 6X4 STRL (CAST SUPPLIES) ×3 IMPLANT
PLATE 3HOLE HOOK (Plate) ×3 IMPLANT
PLATE FIBULA 6H LATERAL (Plate) ×3 IMPLANT
PUTTY DBX 1CC (Putty) ×3 IMPLANT
PUTTY DBX 1CC DEPUY (Putty) ×1 IMPLANT
SCREW BONE NL 2.7X34MM HEXA (Screw) ×1 IMPLANT
SCREW HEX LOCK 2.7X14MM (Screw) ×3 IMPLANT
SCREW LOCK 12X2.7X HEXALOBE (Screw) ×2 IMPLANT
SCREW LOCKING 2.7X12MM (Screw) ×4 IMPLANT
SCREW NL 3.5X10 (Screw) ×3 IMPLANT
SCREW NON LOCK 2.7X16 (Screw) ×3 IMPLANT
SCREW NON LOCK 3.5X10MM (Screw) ×6 IMPLANT
SCREW NON LOCKING HEX 2.7X20MM (Screw) ×3 IMPLANT
SCREW NON LOCKING HEX 2.7X30MM (Screw) ×6 IMPLANT
SCREW NONLOCK 2.7X50MM (Screw) ×3 IMPLANT
SCREW NONLOCK HEX 3.5X12 (Screw) ×9 IMPLANT
SPLINT FIBERGLASS 4X30 (CAST SUPPLIES) ×3 IMPLANT
SPONGE GAUZE 4X4 12PLY STER LF (GAUZE/BANDAGES/DRESSINGS) ×3 IMPLANT
SPONGE LAP 18X18 X RAY DECT (DISPOSABLE) IMPLANT
SUCTION FRAZIER HANDLE 10FR (MISCELLANEOUS) ×2
SUCTION TUBE FRAZIER 10FR DISP (MISCELLANEOUS) ×1 IMPLANT
SUT ETHILON 3 0 PS 1 (SUTURE) ×3 IMPLANT
SUT VIC AB 0 CT1 27 (SUTURE) ×2
SUT VIC AB 0 CT1 27XBRD ANBCTR (SUTURE) ×1 IMPLANT
SUT VIC AB 2-0 CT1 27 (SUTURE) ×2
SUT VIC AB 2-0 CT1 TAPERPNT 27 (SUTURE) ×1 IMPLANT
SYR CONTROL 10ML LL (SYRINGE) IMPLANT
TOWEL OR 17X24 6PK STRL BLUE (TOWEL DISPOSABLE) ×3 IMPLANT
TOWEL OR 17X26 10 PK STRL BLUE (TOWEL DISPOSABLE) ×6 IMPLANT
TUBE CONNECTING 12'X1/4 (SUCTIONS) ×1
TUBE CONNECTING 12X1/4 (SUCTIONS) ×2 IMPLANT
WATER STERILE IRR 1000ML POUR (IV SOLUTION) ×3 IMPLANT

## 2016-01-02 NOTE — Discharge Instructions (Signed)
° ° °  1. Keep splint clean and dry °2. Elevate foot above level of the heart °3. Take aspirin to prevent blood clots °4. Take pain meds as needed °5. Strict non weight bearing to operative extremity ° °
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
## 2016-01-02 NOTE — Progress Notes (Signed)
Orthopedic Tech Progress Note Patient Details:  Jesse Elliott March 31, 1981 [id number]  Patient ID: Jesse Elliott, male   DOB: March 31, 1981, 35 y.o.   MRN: [id number] Applied ohf  Jesse Elliott, Jesse Elliott 01/02/2016, 10:13 PM

## 2016-01-02 NOTE — Progress Notes (Signed)
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

## 2016-01-02 NOTE — Anesthesia Postprocedure Evaluation (Signed)
Anesthesia Post Note  Patient: Jesse Elliott  Procedure(s) Performed: Procedure(s) (LRB): OPEN REDUCTION INTERNAL FIXATION (ORIF) RIGHT TRIMALLEOLAR ANKLE FRACTURE (Right)  Patient location during evaluation: PACU Anesthesia Type: General and Regional Level of consciousness: awake and awake and alert Pain management: pain level controlled Vital Signs Assessment: post-procedure vital signs reviewed and stable Respiratory status: spontaneous breathing, nonlabored ventilation and respiratory function stable Cardiovascular status: blood pressure returned to baseline Anesthetic complications: no    Last Vitals:  Filed Vitals:   01/02/16 1236 01/02/16 1810  BP: 132/84   Pulse: 89   Temp: 36.9 C 36.3 C  Resp: 18     Last Pain: There were no vitals filed for this visit.               Quierra Silverio COKER

## 2016-01-02 NOTE — Anesthesia Preprocedure Evaluation (Addendum)
Anesthesia Evaluation  Patient identified by MRN, date of birth, ID band Patient awake    Reviewed: Allergy & Precautions, NPO status , Patient's Chart, lab work & pertinent test results  Airway Mallampati: II  TM Distance: >3 FB Neck ROM: Full    Dental  (+) Teeth Intact, Missing, Dental Advisory Given,    Pulmonary Current Smoker,    breath sounds clear to auscultation       Cardiovascular  Rhythm:Regular Rate:Normal     Neuro/Psych    GI/Hepatic   Endo/Other    Renal/GU      Musculoskeletal   Abdominal   Peds  Hematology   Anesthesia Other Findings   Reproductive/Obstetrics                           Anesthesia Physical Anesthesia Plan  ASA: II  Anesthesia Plan: General and Regional   Post-op Pain Management:    Induction: Intravenous  Airway Management Planned: LMA  Additional Equipment:   Intra-op Plan:   Post-operative Plan:   Informed Consent: I have reviewed the patients History and Physical, chart, labs and discussed the procedure including the risks, benefits and alternatives for the proposed anesthesia with the patient or authorized representative who has indicated his/her understanding and acceptance.   Dental advisory given  Plan Discussed with: CRNA and Anesthesiologist  Anesthesia Plan Comments:        Anesthesia Quick Evaluation

## 2016-01-02 NOTE — Anesthesia Procedure Notes (Addendum)
Procedure Name: LMA Insertion Date/Time: 01/02/2016 3:40 PM Performed by: Adonis HousekeeperNGELL, JANNA M Pre-anesthesia Checklist: Patient identified, Emergency Drugs available, Suction available and Patient being monitored Patient Re-evaluated:Patient Re-evaluated prior to inductionOxygen Delivery Method: Circle system utilized Preoxygenation: Pre-oxygenation with 100% oxygen Intubation Type: IV induction Ventilation: Mask ventilation without difficulty LMA Size: 4.0 Number of attempts: 2 Placement Confirmation: positive ETCO2 and breath sounds checked- equal and bilateral Tube secured with: Tape Dental Injury: Teeth and Oropharynx as per pre-operative assessment    Anesthesia Regional Block:  Adductor canal block  Pre-Anesthetic Checklist: ,, timeout performed, Correct Patient, Correct Site, Correct Laterality, Correct Procedure, Correct Position, site marked, Risks and benefits discussed,  Surgical consent,  Pre-op evaluation,  At surgeon's request and post-op pain management  Laterality: Right  Prep: chloraprep       Needles:  Injection technique: Single-shot  Needle Type: Echogenic Stimulator Needle     Needle Length: 9cm 9 cm Needle Gauge: 21 and 21 G    Additional Needles:  Procedures: nerve stimulator Adductor canal block Narrative:  Start time: 01/02/2016 3:00 PM End time: 01/02/2016 3:05 PM Injection made incrementally with aspirations every 5 mL.  Performed by: Personally   Additional Notes: 40 cc 0.5% Bupivacaine with 1:200 Epi injected easily

## 2016-01-02 NOTE — Transfer of Care (Signed)
Immediate Anesthesia Transfer of Care Note  Patient: Jesse Elliott  Procedure(s) Performed: Procedure(s): OPEN REDUCTION INTERNAL FIXATION (ORIF) RIGHT TRIMALLEOLAR ANKLE FRACTURE (Right)  Patient Location: PACU  Anesthesia Type:General  Level of Consciousness: awake, alert , oriented and patient cooperative  Airway & Oxygen Therapy: Patient Spontanous Breathing and Patient connected to nasal cannula oxygen  Post-op Assessment: Report given to RN, Post -op Vital signs reviewed and stable, Patient moving all extremities and Patient moving all extremities X 4  Post vital signs: Reviewed and stable  Last Vitals:  Filed Vitals:   01/02/16 1236  BP: 132/84  Pulse: 89  Temp: 36.9 C  Resp: 18    Last Pain: There were no vitals filed for this visit.    Patients Stated Pain Goal: 6 (01/01/16 1001)  Complications: No apparent anesthesia complications

## 2016-01-02 NOTE — H&P (Signed)
    PREOPERATIVE H&P  Chief Complaint: right ankle fracture  HPI: Jesse Elliott is a 35 y.o. male who presents for surgical treatment of right ankle fracture.  He denies any changes in medical history.  Past Medical History  Diagnosis Date  . Pneumonia 2004  . History of bronchitis 2013  . Joint pain   . Chronic back pain     reason unknown  . Acute renal failure (HCC) 01/2015    states was told it was from food poisoning/vomiting  . Hyperthyroidism     never been on meds  . History of MRSA infection 02/2015   Past Surgical History  Procedure Laterality Date  . Wisdom teeth extracted      Social History   Social History  . Marital Status: Single    Spouse Name: N/A  . Number of Children: N/A  . Years of Education: N/A   Social History Main Topics  . Smoking status: Current Every Day Smoker -- 0.25 packs/day for 10 years  . Smokeless tobacco: Never Used  . Alcohol Use: No  . Drug Use: No  . Sexual Activity: Yes   Other Topics Concern  . None   Social History Narrative   History reviewed. No pertinent family history. No Known Allergies Prior to Admission medications   Medication Sig Start Date End Date Taking? Authorizing Provider  doxycycline (VIBRA-TABS) 100 MG tablet Take 1 tablet (100 mg total) by mouth 2 (two) times daily. 10/10/15   Shon Batonourtney F Horton, MD  methocarbamol (ROBAXIN-750) 750 MG tablet Take 1 tablet (750 mg total) by mouth 4 (four) times daily. 12/28/15   Lorre NickAnthony Allen, MD  mupirocin ointment (BACTROBAN) 2 % Place 1 application into the nose 2 (two) times daily. 12/28/15   Lorre NickAnthony Allen, MD  oxyCODONE-acetaminophen (PERCOCET/ROXICET) 5-325 MG tablet Take 1-2 tablets by mouth every 4 (four) hours as needed for severe pain. 12/28/15   Lorre NickAnthony Allen, MD     Positive ROS: All other systems have been reviewed and were otherwise negative with the exception of those mentioned in the HPI and as above.  Physical Exam: General: Alert, no acute  distress Cardiovascular: No pedal edema Respiratory: No cyanosis, no use of accessory musculature GI: abdomen soft Skin: No lesions in the area of chief complaint Neurologic: Sensation intact distally Psychiatric: Patient is competent for consent with normal mood and affect Lymphatic: no lymphedema  MUSCULOSKELETAL: exam stable  Assessment: right ankle fracture  Plan: Plan for Procedure(s): OPEN REDUCTION INTERNAL FIXATION (ORIF) RIGHT TRIMALLEOLAR ANKLE FRACTURE  The risks benefits and alternatives were discussed with the patient including but not limited to the risks of nonoperative treatment, versus surgical intervention including infection, bleeding, nerve injury,  blood clots, cardiopulmonary complications, morbidity, mortality, among others, and they were willing to proceed.   Cheral AlmasXu, Berlie Persky Michael, MD   01/02/2016 7:31 AM

## 2016-01-03 ENCOUNTER — Encounter (HOSPITAL_COMMUNITY): Payer: Self-pay | Admitting: Orthopaedic Surgery

## 2016-01-03 DIAGNOSIS — S82851A Displaced trimalleolar fracture of right lower leg, initial encounter for closed fracture: Secondary | ICD-10-CM | POA: Diagnosis not present

## 2016-01-03 LAB — MRSA PCR SCREENING: MRSA by PCR: NEGATIVE

## 2016-01-03 MED ORDER — HYDROMORPHONE HCL 2 MG PO TABS
2.0000 mg | ORAL_TABLET | ORAL | Status: DC | PRN
  Administered 2016-01-03: 4 mg via ORAL
  Filled 2016-01-03: qty 2

## 2016-01-03 MED ORDER — DIAZEPAM 5 MG PO TABS
5.0000 mg | ORAL_TABLET | Freq: Four times a day (QID) | ORAL | Status: DC | PRN
  Administered 2016-01-03: 5 mg via ORAL
  Filled 2016-01-03: qty 1

## 2016-01-03 MED ORDER — ASPIRIN EC 325 MG PO TBEC: 325.0000 mg | DELAYED_RELEASE_TABLET | Freq: Two times a day (BID) | ORAL | Status: AC

## 2016-01-03 MED ORDER — ONDANSETRON HCL 4 MG PO TABS: 4.0000 mg | ORAL_TABLET | Freq: Three times a day (TID) | ORAL | Status: AC | PRN

## 2016-01-03 MED ORDER — SENNOSIDES-DOCUSATE SODIUM 8.6-50 MG PO TABS: 1.0000 | ORAL_TABLET | Freq: Every evening | ORAL | Status: AC | PRN

## 2016-01-03 MED ORDER — OXYCODONE-ACETAMINOPHEN 5-325 MG PO TABS: 1.0000 | ORAL_TABLET | ORAL | Status: AC | PRN

## 2016-01-03 MED ORDER — OXYCODONE HCL ER 10 MG PO T12A: 10.0000 mg | EXTENDED_RELEASE_TABLET | Freq: Two times a day (BID) | ORAL | Status: AC

## 2016-01-03 MED ORDER — HYDROMORPHONE HCL 1 MG/ML IJ SOLN
1.0000 mg | INTRAMUSCULAR | Status: DC | PRN
Start: 2016-01-03 — End: 2016-01-03
  Administered 2016-01-03 (×2): 2 mg via INTRAVENOUS
  Administered 2016-01-03: 1 mg via INTRAVENOUS
  Administered 2016-01-03: 2 mg via INTRAVENOUS
  Filled 2016-01-03 (×2): qty 2
  Filled 2016-01-03: qty 1
  Filled 2016-01-03: qty 2

## 2016-01-03 MED ORDER — METHOCARBAMOL 750 MG PO TABS: 750.0000 mg | ORAL_TABLET | Freq: Two times a day (BID) | ORAL | Status: AC | PRN

## 2016-01-03 NOTE — Progress Notes (Addendum)
Pt is "writhing" in pain. Pt repts pain of right ankle a "10" on a scale of 1-10. Pt shakes and groans in pain. Pt almost hyperventilates with the "pain". Pt instructed breathing exercises for relaxation. Pt repts that preop at home he was maintaining his pain on oxycodone 5 mg every 4 hours as needed.  RLE elevated above the heart. Multiple ice packs applied. Pt has + cap refill of R toes, + movement, toes are warm to touch and R dorsalis pedis pulse 2+ under ace cast splint. Pt currently is receiving oxycodone 15 mg every 3 hours PO prn for pain, Dilaudid 2 mg IV every 2 hours prn for pain and Robaxin 500 mg every 6 hours as needed spasm. Pt has been given all PRN medications and pt denies any change in pain. All rept to Dr. Roda ShuttersXu. Per his order, will give valium 5-10 mg PO every 6 hours as needed for anxiety/muscle spasm, and will change to Dilaudid PO 2-4 mg every 4 hours as needed for pain. Per MD order, will proceed with plan of care of D/C to home after PT. Will continue to monitor.

## 2016-01-03 NOTE — Care Management Note (Signed)
Case Management Note  Patient Details  Name: Leodis Rainseddy R Mcmillion MRN: [id number] Date of Birth: 1981-06-21  Subjective/Objective:            S/p ORIF for right trimalleolar ankle fracture        Action/Plan: PT and OT recommending HHPT and HHOT. Spoke with patient about disharge plan, [facility] not [facility]. Contacted [facility], spoke with Lauren, set up HHPT nad HHOT, [facility], [facility] Contacted James with [facility] nad requested wheelcahir and 3N1 be delivered to patient's room. Patient stated that he has family to assist prn.   Expected Discharge Date:                  Expected Discharge Plan:  Home w Home Health Services  In-House Referral:  NA  Discharge planning Services  CM Consult  Post Acute Care Choice:  Durable Medical Equipment, Home Health Choice offered to:  Patient  DME Arranged:  3-N-1, Wheelchair manual DME Agency:  [facility]  [facility]  [facility]  Other - See comment  Status of Service:  Completed, signed off  Medicare Important Message Given:    Date Medicare IM Given:    Medicare IM give by:    Date Additional Medicare IM Given:    Additional Medicare Important Message give by:     If discussed at Long Length of Stay Meetings, dates discussed:    Additional Comments:  Monica BectonKrieg, Ameli Sangiovanni Watson, RN 01/03/2016, 1:26 PM
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
## 2016-01-03 NOTE — Progress Notes (Signed)
Pt's family in for discharge home. RN in room to provide discharge teachings. Per Terese DoorSylvia Green NT, pt was assisted by her into bathroom but pt ambulated out of bathroom back to recliner by himself. Pt cautioned regarding falls. Pt struggled to ambulate with PT shortly prior to this due to pain. When staff, family in room, pt begins moaning, hyperventilating and shaking again and per pt admission this was due to the pain. All discharge teachings performed with pt and family. All prescriptions given. Pt and family verb understanding of all teachings and agree to comply. No change in patient from 1016 AM assessment.

## 2016-01-03 NOTE — Progress Notes (Signed)
RN in to assess pt's pain. Pt sitting in recliner after ambulating with PT. Pt eyes closed and resting comfortably. No shaking, groaning or hyperventilating noted. When pt realized RN in room, pt began shaking, groaning and attempting to hyperventilate. Pt reinstructed breathing exercises. Will continue to monitor.
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
## 2016-01-03 NOTE — Anesthesia Postprocedure Evaluation (Signed)
Anesthesia Post Note  Patient: Jesse Elliott  Procedure(s) Performed: Procedure(s) (LRB): OPEN REDUCTION INTERNAL FIXATION (ORIF) RIGHT TRIMALLEOLAR ANKLE FRACTURE (Right)  Patient location during evaluation: PACU Anesthesia Type: General and Regional Level of consciousness: awake, awake and alert and oriented Pain management: pain level controlled Vital Signs Assessment: post-procedure vital signs reviewed and stable Respiratory status: spontaneous breathing, nonlabored ventilation and respiratory function stable Cardiovascular status: blood pressure returned to baseline Anesthetic complications: no    Last Vitals:  Filed Vitals:   01/03/16 0041 01/03/16 0414  BP: 108/64 127/75  Pulse: 77 97  Temp:  36.8 C  Resp: 16 20    Last Pain:  Filed Vitals:   01/03/16 1325  PainSc: 8                  [name] COKER

## 2016-01-03 NOTE — Progress Notes (Signed)
Pt placed in his wheelchair and d/c to home without incident accompanied by Alfonso EllisHeather Roberson NT who assisted him into his family's vehicle.

## 2016-01-03 NOTE — Progress Notes (Signed)
PT Cancellation Note  Patient Details Name: Jesse Elliott MRN: [id number] DOB: [birth year]   Cancelled Treatment:    Reason Eval/Treat Not Completed: Patient declined. Pt shaking and stating he was waiting to speak to charge nurse and administrator and not a good time for therapy.  Spoke with his nurse and [name], department head about pt declining PT at this time.   [name] 01/03/2016, 9:59 AM
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
## 2016-01-03 NOTE — Progress Notes (Signed)
   Subjective:  Had issues with pain last night.  Required IV pain meds.  Objective:   VITALS:   Filed Vitals:   01/02/16 2200 01/02/16 2320 01/03/16 0041 01/03/16 0414  BP: 115/71 110/75 108/64 127/75  Pulse: 81 86 77 97  Temp: 98 F (36.7 C) 98 F (36.7 C)  98.3 F (36.8 C)  TempSrc:    Oral  Resp: 19 20 16 20   Height:      Weight:      SpO2: 100% 100% 100% 100%    Patient is very jittery and anxious Toes wwp Well fitting splint   Lab Results  Component Value Date   WBC 6.9 01/02/2016   HGB 14.3 01/02/2016   HCT 40.9 01/02/2016   MCV 90.3 01/02/2016   PLT 285 01/02/2016     Assessment/Plan:  1 Day Post-Op   - Up with PT/OT - DVT ppx - SCDs, ambulation, aspirin - NWB operative extremity - Pain control - elevation - home today after PT  Jesse Elliott, Jesse Elliott 01/03/2016, 7:38 AM [phone]
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
## 2016-01-09 ENCOUNTER — Encounter (HOSPITAL_COMMUNITY): Payer: Self-pay | Admitting: Orthopaedic Surgery

## 2016-01-24 ENCOUNTER — Encounter (HOSPITAL_COMMUNITY): Payer: Self-pay | Admitting: Orthopaedic Surgery

## 2017-06-21 IMAGING — RF DG C-ARM 61-120 MIN
1 series · 4 of 4 positions shown · non-contrast
Comparison: CT right ankle dated 12/28/2015

CLINICAL DATA: ORIF right ankle

EXAM:
RIGHT ANKLE - COMPLETE 3+ VIEW; DG C-ARM 61-120 MIN
FLUOROSCOPY TIME:  1 minutes 20 seconds

[Series 1: run · 4 of 4 slices shown]
[im 1/4]
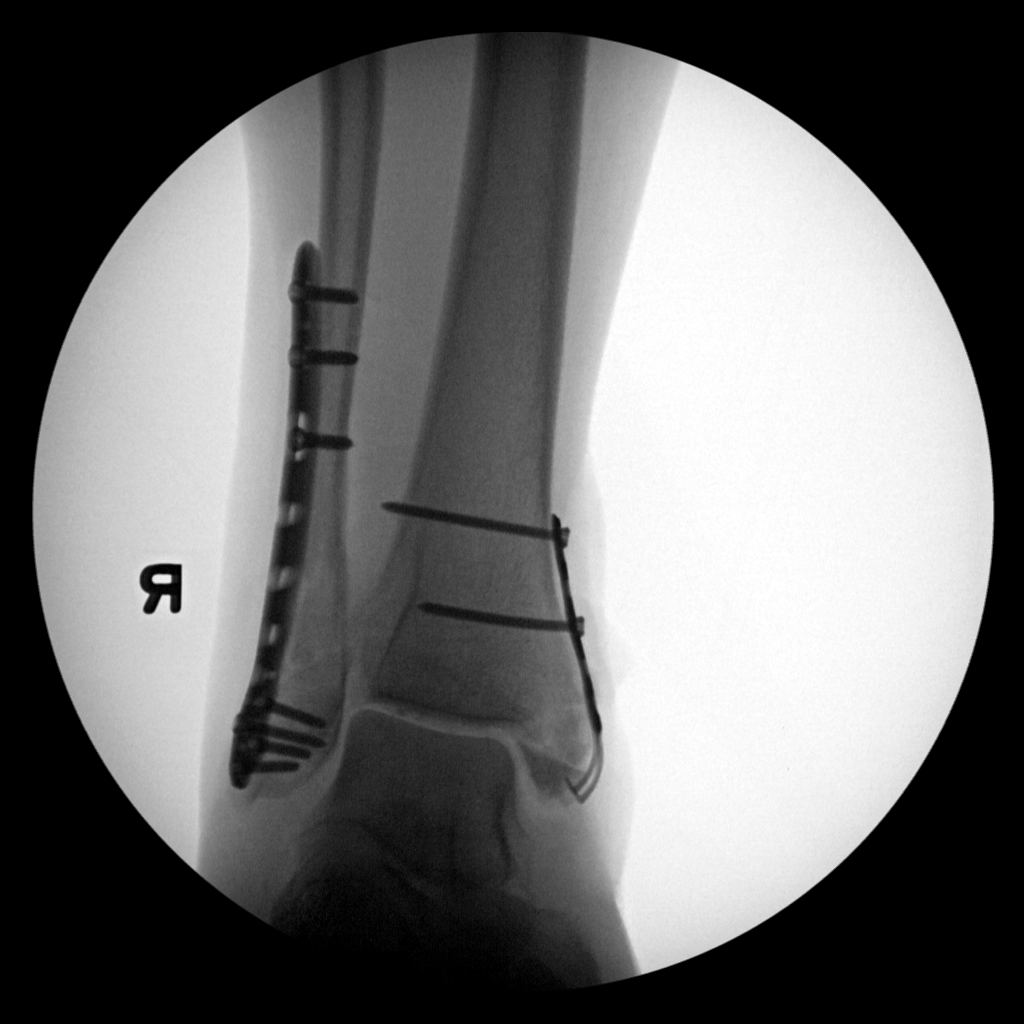
[im 2/4]
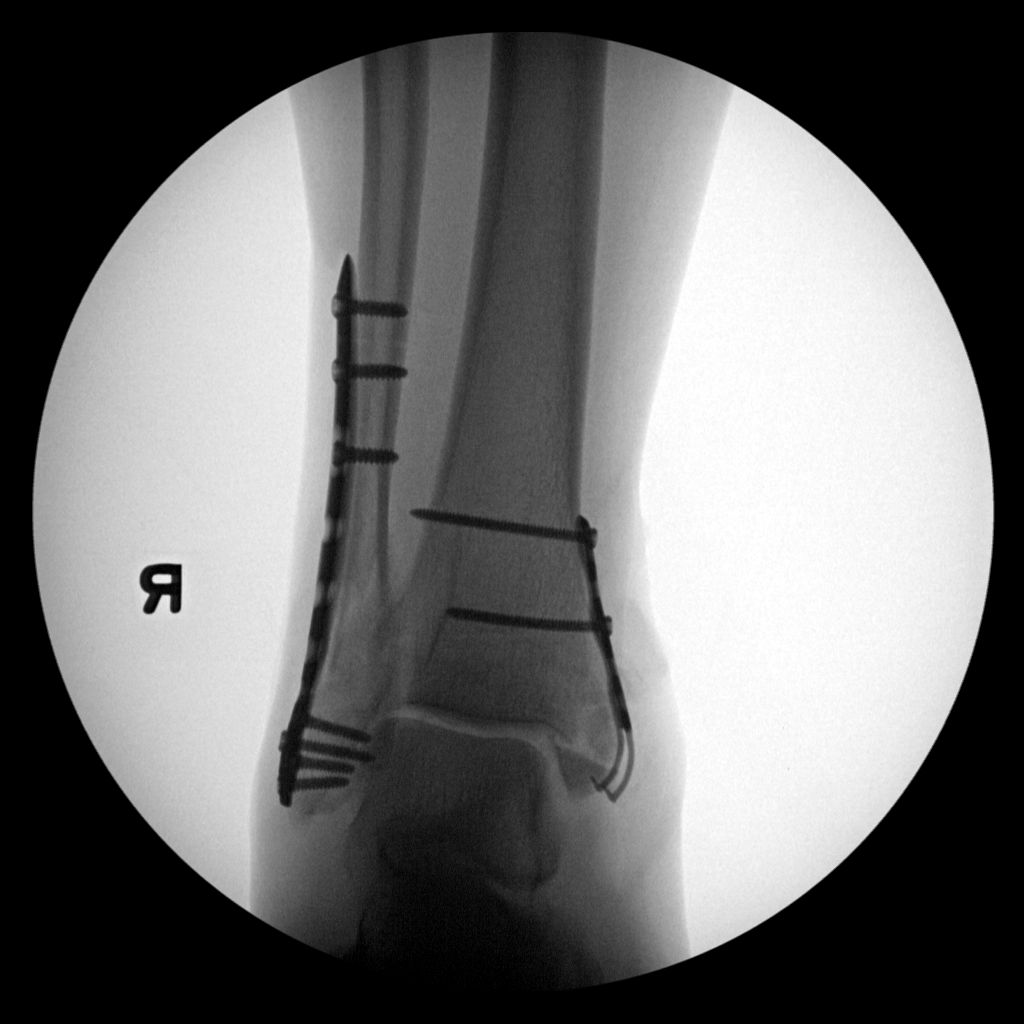
[im 3/4]
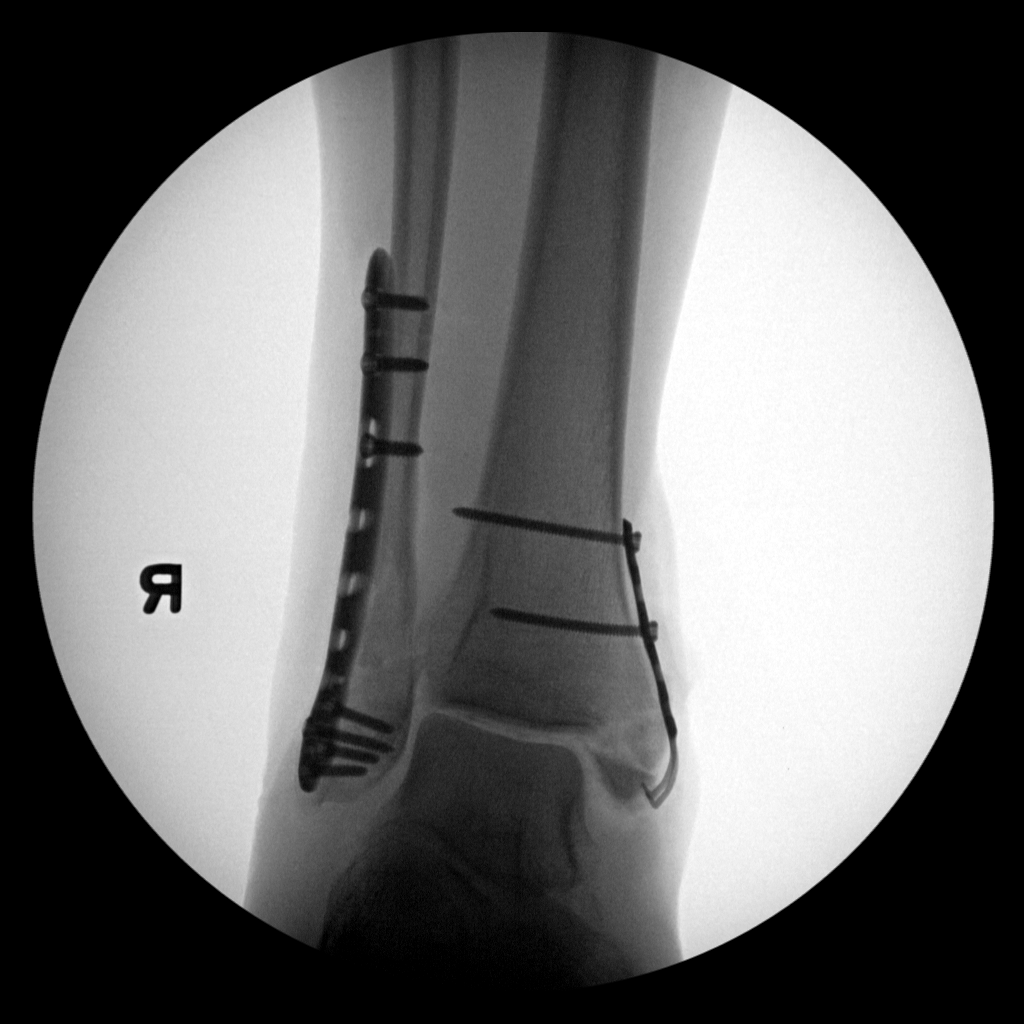
[im 4/4]
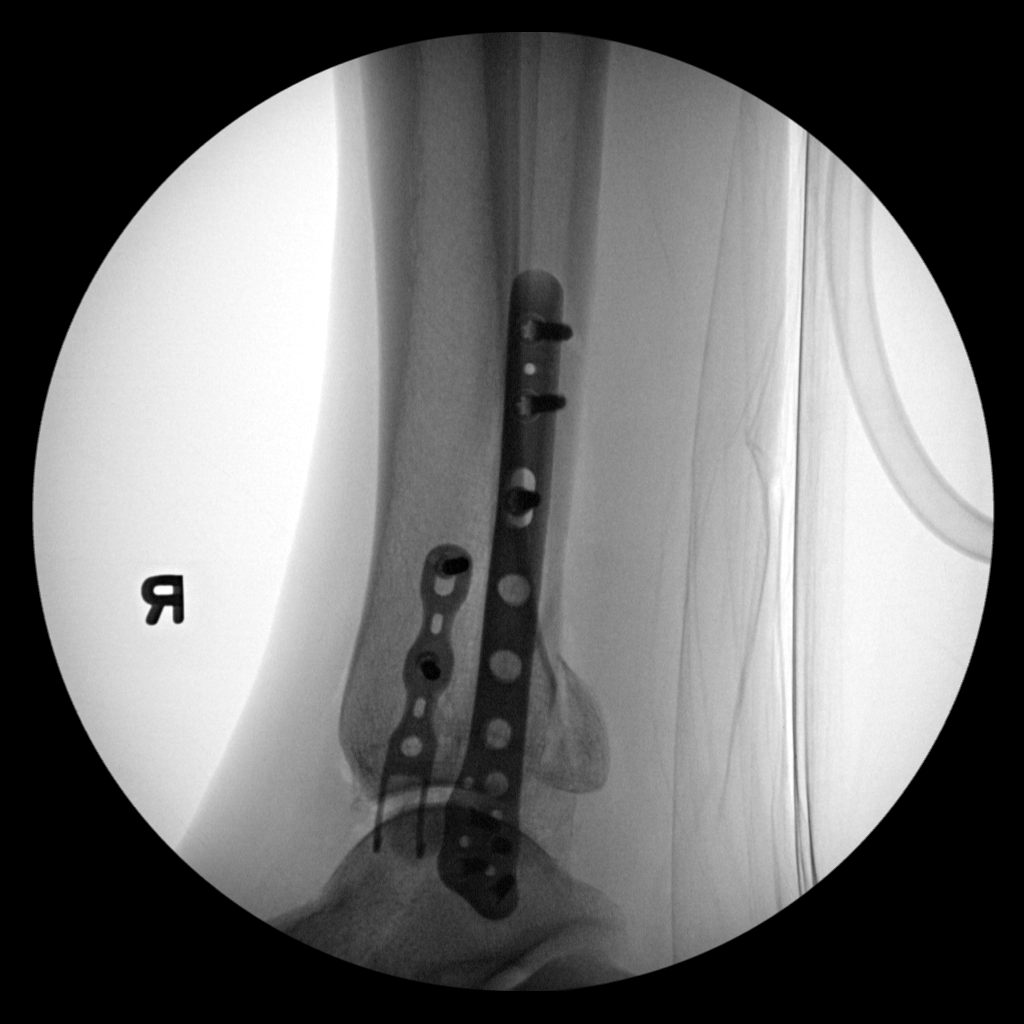

[4 of 4 positions shown; findings below may reference images not displayed]

FINDINGS: Intraoperative fluoroscopic images during lateral compression plate
and screw fixation of the distal fibula as well as compression plate
and screw fixation of the medial malleolus.

Fracture fragments are in near anatomic alignment and position.

Ankle mortise is preserved.
IMPRESSION: Intraoperative fluoroscopic radiographs during ORIF of the right
ankle, as above.

## 2020-02-03 ENCOUNTER — Other Ambulatory Visit: Payer: Self-pay

## 2020-02-03 ENCOUNTER — Emergency Department (HOSPITAL_COMMUNITY)
Admission: EM | Admit: 2020-02-03 | Discharge: 2020-02-04 | Disposition: A | Discharge status: Home / Self Care | Payer: Medicare HMO | Attending: Emergency Medicine | Admitting: Emergency Medicine

## 2020-02-03 DIAGNOSIS — R21 Rash and other nonspecific skin eruption: Secondary | ICD-10-CM | POA: Insufficient documentation

## 2020-02-03 DIAGNOSIS — F1120 Opioid dependence, uncomplicated: Secondary | ICD-10-CM | POA: Insufficient documentation

## 2020-02-03 DIAGNOSIS — Z5321 Procedure and treatment not carried out due to patient leaving prior to being seen by health care provider: Secondary | ICD-10-CM | POA: Insufficient documentation

## 2020-02-03 LAB — CBC WITH DIFFERENTIAL/PLATELET
Abs Immature Granulocytes: 0.06 10*3/uL (ref 0.00–0.07)
Basophils Absolute: 0.1 10*3/uL (ref 0.0–0.1)
Basophils Relative: 0 %
Eosinophils Absolute: 0.2 10*3/uL (ref 0.0–0.5)
Eosinophils Relative: 1 %
HCT: 46.2 % (ref 39.0–52.0)
Hemoglobin: 15.3 g/dL (ref 13.0–17.0)
Immature Granulocytes: 0 %
Lymphocytes Relative: 18 %
Lymphs Abs: 3.1 10*3/uL (ref 0.7–4.0)
MCH: 30.2 pg (ref 26.0–34.0)
MCHC: 33.1 g/dL (ref 30.0–36.0)
MCV: 91.1 fL (ref 80.0–100.0)
Monocytes Absolute: 1.6 10*3/uL — ABNORMAL HIGH (ref 0.1–1.0)
Monocytes Relative: 10 %
Neutro Abs: 11.8 10*3/uL — ABNORMAL HIGH (ref 1.7–7.7)
Neutrophils Relative %: 71 %
Platelets: 284 10*3/uL (ref 150–400)
RBC: 5.07 MIL/uL (ref 4.22–5.81)
RDW: 13 % (ref 11.5–15.5)
WBC: 16.8 10*3/uL — ABNORMAL HIGH (ref 4.0–10.5)
nRBC: 0 % (ref 0.0–0.2)

## 2020-02-03 LAB — COMPREHENSIVE METABOLIC PANEL
ALT: 113 U/L — ABNORMAL HIGH (ref 0–44)
AST: 81 U/L — ABNORMAL HIGH (ref 15–41)
Albumin: 3.4 g/dL — ABNORMAL LOW (ref 3.5–5.0)
Alkaline Phosphatase: 139 U/L — ABNORMAL HIGH (ref 38–126)
Anion gap: 9 (ref 5–15)
BUN: 15 mg/dL (ref 6–20)
CO2: 24 mmol/L (ref 22–32)
Calcium: 9.1 mg/dL (ref 8.9–10.3)
Chloride: 104 mmol/L (ref 98–111)
Creatinine, Ser: 0.93 mg/dL (ref 0.61–1.24)
GFR calc Af Amer: >60 mL/min (ref >60)
GFR calc non Af Amer: >60 mL/min (ref >60)
Glucose, Bld: 73 mg/dL (ref 70–99)
Potassium: 4.2 mmol/L (ref 3.5–5.1)
Sodium: 137 mmol/L (ref 135–145)
Total Bilirubin: 0.5 mg/dL (ref 0.3–1.2)
Total Protein: 7.8 g/dL (ref 6.5–8.1)

## 2020-02-03 LAB — URINALYSIS, ROUTINE W REFLEX MICROSCOPIC
Bilirubin Urine: NEGATIVE
Glucose, UA: NEGATIVE mg/dL
Hgb urine dipstick: NEGATIVE
Ketones, ur: NEGATIVE mg/dL
Leukocytes,Ua: NEGATIVE
Nitrite: NEGATIVE
Protein, ur: NEGATIVE mg/dL
Specific Gravity, Urine: 1.029 (ref 1.005–1.030)
pH: 5 (ref 5.0–8.0)

## 2020-02-03 LAB — LACTIC ACID, PLASMA: Lactic Acid, Venous: 1.2 mmol/L (ref 0.5–1.9)

## 2020-02-03 MED ORDER — SODIUM CHLORIDE 0.9% FLUSH: 3.0000 mL | Freq: Once | INTRAVENOUS | Status: DC
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
## 2020-02-04 NOTE — ED Notes (Signed)
Pt not answer called multiple times

## 2020-03-14 ENCOUNTER — Encounter: Payer: Self-pay | Admitting: Emergency Medicine

## 2020-03-14 ENCOUNTER — Ambulatory Visit
Admission: EM | Admit: 2020-03-14 | Discharge: 2020-03-14 | Disposition: A | Discharge status: Home / Self Care | Payer: Medicare HMO | Attending: Emergency Medicine | Admitting: Emergency Medicine

## 2020-03-14 DIAGNOSIS — L089 Local infection of the skin and subcutaneous tissue, unspecified: Secondary | ICD-10-CM | POA: Insufficient documentation

## 2020-03-14 DIAGNOSIS — T148XXA Other injury of unspecified body region, initial encounter: Secondary | ICD-10-CM | POA: Insufficient documentation

## 2020-03-14 DIAGNOSIS — L539 Erythematous condition, unspecified: Secondary | ICD-10-CM | POA: Insufficient documentation

## 2020-03-14 MED ORDER — CEFTRIAXONE SODIUM 500 MG IJ SOLR
500.0000 mg | Freq: Once | INTRAMUSCULAR | Status: AC
  Administered 2020-03-14: 500 mg via INTRAMUSCULAR

## 2020-03-14 MED ORDER — MUPIROCIN 2 % EX OINT: 1.0000 "application " | TOPICAL_OINTMENT | Freq: Two times a day (BID) | CUTANEOUS | 0 refills | Status: DC

## 2020-03-14 MED ORDER — SULFAMETHOXAZOLE-TRIMETHOPRIM 800-160 MG PO TABS: 1.0000 | ORAL_TABLET | Freq: Two times a day (BID) | ORAL | 0 refills | Status: AC

## 2020-03-14 NOTE — ED Provider Notes (Signed)
[facility]   [id number] 03/14/20 Arrival Time: 1316   Chief Complaint  Patient presents with   Wound Infection    left foot     SUBJECTIVE: History from: patient.  Jesse Elliott is a 39 y.o. male who presented to the urgent care with a complaint of left foot wound infection for the past 2 weeks.  Reported a scratch on the left foot 2 weeks ago.  Localized pain, swelling and purulent discharge to the left foot.  He describes the pain as constant and achy.  Has tried OTC Neosporin and peroxide with no relief.  His symptoms are made worse with ROM.  He denies similar symptoms in the past.  Denies chills, fever, nausea, vomiting, diarrhea  ROS: As per HPI.  All other pertinent ROS negative.      Past Medical History:  Diagnosis Date   Acute renal failure (HCC) 01/2015   states was told it was from food poisoning/vomiting   Chronic back pain    reason unknown   History of bronchitis 2013   History of MRSA infection 02/2015   Hyperthyroidism    never been on meds   Joint pain    Pneumonia 2004   Past Surgical History:  Procedure Laterality Date   ORIF ANKLE FRACTURE Right 01/02/2016   Procedure: OPEN REDUCTION INTERNAL FIXATION (ORIF) RIGHT TRIMALLEOLAR ANKLE FRACTURE;  Surgeon: Tarry Kos, MD;  Location: [facility] OR;  Service: Orthopedics;  Laterality: Right;   wisdom teeth extracted      No Known Allergies No current facility-administered medications on file prior to encounter.   Current Outpatient Medications on File Prior to Encounter  Medication Sig Dispense Refill   aspirin EC 325 MG tablet Take 1 tablet (325 mg total) by mouth 2 (two) times daily. 84 tablet 0   doxycycline (VIBRA-TABS) 100 MG tablet Take 1 tablet (100 mg total) by mouth 2 (two) times daily. 14 tablet 0   methocarbamol (ROBAXIN) 750 MG tablet Take 1 tablet (750 mg total) by mouth 2 (two) times daily as needed for muscle spasms. 60 tablet 0   methocarbamol (ROBAXIN-750) 750 MG  tablet Take 1 tablet (750 mg total) by mouth 4 (four) times daily. 30 tablet 0   ondansetron (ZOFRAN) 4 MG tablet Take 1-2 tablets (4-8 mg total) by mouth every 8 (eight) hours as needed for nausea or vomiting. 40 tablet 0   oxyCODONE (OXYCONTIN) 10 mg 12 hr tablet Take 1 tablet (10 mg total) by mouth every 12 (twelve) hours. 10 tablet 0   oxyCODONE-acetaminophen (PERCOCET) 5-325 MG tablet Take 1-2 tablets by mouth every 4 (four) hours as needed for severe pain. 90 tablet 0   oxyCODONE-acetaminophen (PERCOCET/ROXICET) 5-325 MG tablet Take 1-2 tablets by mouth every 4 (four) hours as needed for severe pain. 30 tablet 0   senna-docusate (SENOKOT S) 8.6-50 MG tablet Take 1 tablet by mouth at bedtime as needed. 30 tablet 1   Social History   Socioeconomic History   Marital status: Single    Spouse name: Not on file   Number of children: Not on file   Years of education: Not on file   Highest education level: Not on file  Occupational History   Not on file  Tobacco Use   Smoking status: Current Every Day Smoker    Packs/day: 0.25    Years: 10.00    Pack years: 2.50   Smokeless tobacco: Never Used  Substance and Sexual Activity   Alcohol use: No  Drug use: No   Sexual activity: Yes  Other Topics Concern   Not on file  Social History Narrative   Not on file   Social Determinants of Health   Financial Resource Strain:    Difficulty of Paying Living Expenses:   Food Insecurity:    Worried About Programme researcher, broadcasting/film/video in the Last Year:    Barista in the Last Year:   Transportation Needs:    Freight forwarder (Medical):    Lack of Transportation (Non-Medical):   Physical Activity:    Days of Exercise per Week:    Minutes of Exercise per Session:   Stress:    Feeling of Stress :   Social Connections:    Frequency of Communication with Friends and Family:    Frequency of Social Gatherings with Friends and Family:    Attends Religious  Services:    Active Member of Clubs or Organizations:    Attends Engineer, structural:    Marital Status:   Intimate Partner Violence:    Fear of Current or Ex-Partner:    Emotionally Abused:    Physically Abused:    Sexually Abused:    No family history on file.  OBJECTIVE:  Vitals:   03/14/20 1332 03/14/20 1333  BP: 124/85   Pulse: 77   Resp: 18   Temp: 97.9 F (36.6 C)   TempSrc: Oral   SpO2: 94%   Weight:  251 lb (113.9 kg)     Physical Exam Vitals and nursing note reviewed.  Constitutional:      General: He is not in acute distress.    Appearance: Normal appearance. He is normal weight. He is not ill-appearing, toxic-appearing or diaphoretic.  Cardiovascular:     Rate and Rhythm: Normal rate and regular rhythm.     Pulses: Normal pulses.     Heart sounds: Normal heart sounds. No murmur heard.  No friction rub. No gallop.   Pulmonary:     Effort: Pulmonary effort is normal. No respiratory distress.     Breath sounds: Normal breath sounds. No stridor. No wheezing, rhonchi or rales.  Chest:     Chest wall: No tenderness.  Skin:    General: Skin is warm.     Findings: Erythema and wound present. No rash.     Comments:  Left foot: 1.5 cm X 0.5 wound present surrounded by erythema with a diameter of 6 cm  Neurological:     Mental Status: He is alert and oriented to person, place, and time.     LABS:  No results found for this or any previous visit (from the past 24 hour(s)).   ASSESSMENT & PLAN:  1. Wound infection   2. Erythema     Meds ordered this encounter  Medications   cefTRIAXone (ROCEPHIN) injection 500 mg   sulfamethoxazole-trimethoprim (BACTRIM DS) 800-160 MG tablet    Sig: Take 1 tablet by mouth 2 (two) times daily for 7 days.    Dispense:  14 tablet    Refill:  0   mupirocin ointment (BACTROBAN) 2 %    Sig: Apply 1 application topically 2 (two) times daily.    Dispense:  22 g    Refill:  0    Discharge  instructions  Rocephin 500 mg IM was given in office Keep wound clean with water and mild soap Apply thin layer of Bactroban Take Bactrim DS as prescribed and to completion Keep it open to air at home  Follow-up with PCP Return or go to ED for worsening of symptoms  Reviewed expectations re: course of current medical issues. Questions answered. Outlined signs and symptoms indicating need for more acute intervention. Patient verbalized understanding. After Visit Summary given.      Note: This document was prepared using Dragon voice recognition software and may include unintentional dictation errors.    Durward Parcel, FNP 03/14/20 1411

## 2020-03-14 NOTE — ED Triage Notes (Signed)
About 2 weeks ago scratched left foot on boot, wound has just gotten worse.  Has been using antibiotic ointment on it.

## 2020-03-14 NOTE — Discharge Instructions (Signed)
Rocephin 500 mg IM was given in office Keep wound clean with water and mild soap Apply thin layer of Bactroban Take Bactrim DS as prescribed and to completion Keep it open to air at home Follow-up with PCP Return or go to ED for worsening of symptoms

## 2020-03-16 LAB — AEROBIC CULTURE  (SUPERFICIAL SPECIMEN)

## 2020-03-17 LAB — AEROBIC CULTURE W GRAM STAIN (SUPERFICIAL SPECIMEN): Gram Stain: NONE SEEN

## 2020-03-17 LAB — AEROBIC CULTURE  (SUPERFICIAL SPECIMEN): Culture: NO GROWTH

## 2020-03-23 ENCOUNTER — Telehealth: Payer: Self-pay

## 2020-03-23 ENCOUNTER — Other Ambulatory Visit: Payer: Self-pay

## 2020-03-23 ENCOUNTER — Ambulatory Visit
Admission: EM | Admit: 2020-03-23 | Discharge: 2020-03-23 | Disposition: A | Discharge status: Home / Self Care | Payer: Medicare HMO | Attending: Family Medicine | Admitting: Family Medicine

## 2020-03-23 DIAGNOSIS — L03116 Cellulitis of left lower limb: Secondary | ICD-10-CM

## 2020-03-23 MED ORDER — SULFAMETHOXAZOLE-TRIMETHOPRIM 800-160 MG PO TABS
1.0000 | ORAL_TABLET | Freq: Two times a day (BID) | ORAL | 0 refills | Status: AC
Start: 2020-03-23 — End: 2020-03-30

## 2020-03-23 MED ORDER — CEFTRIAXONE SODIUM 500 MG IJ SOLR
500.0000 mg | Freq: Once | INTRAMUSCULAR | Status: AC
  Administered 2020-03-23: 500 mg via INTRAMUSCULAR

## 2020-03-23 MED ORDER — MUPIROCIN 2 % EX OINT: 1.0000 "application " | TOPICAL_OINTMENT | Freq: Two times a day (BID) | CUTANEOUS | 0 refills | Status: AC

## 2020-03-23 NOTE — ED Provider Notes (Addendum)
[facility]   [id number] 03/23/20 Arrival Time: 1127  CC: RASH  SUBJECTIVE:  Jesse Elliott is a 39 y.o. male who presents with a skin complaint that began about 2 weeks ago. Reports that he was seen in this office and treated for skin infection with Bactrim and antibiotic cream. Reports that the area to his left foot is healing some but is still not completely healed.  Denies fever, chills, nausea, vomiting, erythema, swelling, discharge, oral lesions, SOB, chest pain, abdominal pain, changes in bowel or bladder function.    ROS: As per HPI.  All other pertinent ROS negative.     Past Medical History:  Diagnosis Date  . Acute renal failure (HCC) 01/2015   states was told it was from food poisoning/vomiting  . Chronic back pain    reason unknown  . History of bronchitis 2013  . History of MRSA infection 02/2015  . Hyperthyroidism    never been on meds  . Joint pain   . Pneumonia 2004   Past Surgical History:  Procedure Laterality Date  . ORIF ANKLE FRACTURE Right 01/02/2016   Procedure: OPEN REDUCTION INTERNAL FIXATION (ORIF) RIGHT TRIMALLEOLAR ANKLE FRACTURE;  Surgeon: Tarry Kos, MD;  Location: [facility] OR;  Service: Orthopedics;  Laterality: Right;  . wisdom teeth extracted      No Known Allergies No current facility-administered medications on file prior to encounter.   Current Outpatient Medications on File Prior to Encounter  Medication Sig Dispense Refill  . aspirin EC 325 MG tablet Take 1 tablet (325 mg total) by mouth 2 (two) times daily. 84 tablet 0  . doxycycline (VIBRA-TABS) 100 MG tablet Take 1 tablet (100 mg total) by mouth 2 (two) times daily. 14 tablet 0  . methocarbamol (ROBAXIN) 750 MG tablet Take 1 tablet (750 mg total) by mouth 2 (two) times daily as needed for muscle spasms. 60 tablet 0  . methocarbamol (ROBAXIN-750) 750 MG tablet Take 1 tablet (750 mg total) by mouth 4 (four) times daily. 30 tablet 0  . ondansetron (ZOFRAN) 4 MG tablet Take 1-2  tablets (4-8 mg total) by mouth every 8 (eight) hours as needed for nausea or vomiting. 40 tablet 0  . oxyCODONE (OXYCONTIN) 10 mg 12 hr tablet Take 1 tablet (10 mg total) by mouth every 12 (twelve) hours. 10 tablet 0  . oxyCODONE-acetaminophen (PERCOCET) 5-325 MG tablet Take 1-2 tablets by mouth every 4 (four) hours as needed for severe pain. 90 tablet 0  . oxyCODONE-acetaminophen (PERCOCET/ROXICET) 5-325 MG tablet Take 1-2 tablets by mouth every 4 (four) hours as needed for severe pain. 30 tablet 0  . senna-docusate (SENOKOT S) 8.6-50 MG tablet Take 1 tablet by mouth at bedtime as needed. 30 tablet 1   Social History   Socioeconomic History  . Marital status: Single    Spouse name: Not on file  . Number of children: Not on file  . Years of education: Not on file  . Highest education level: Not on file  Occupational History  . Not on file  Tobacco Use  . Smoking status: Current Every Day Smoker    Packs/day: 0.25    Years: 10.00    Pack years: 2.50  . Smokeless tobacco: Never Used  Substance and Sexual Activity  . Alcohol use: No  . Drug use: No  . Sexual activity: Yes  Other Topics Concern  . Not on file  Social History Narrative  . Not on file   Social Determinants of Health  Financial Resource Strain:   . Difficulty of Paying Living Expenses:   Food Insecurity:   . Worried About Programme researcher, broadcasting/film/video in the Last Year:   . Barista in the Last Year:   Transportation Needs:   . Freight forwarder (Medical):   Marland Kitchen Lack of Transportation (Non-Medical):   Physical Activity:   . Days of Exercise per Week:   . Minutes of Exercise per Session:   Stress:   . Feeling of Stress :   Social Connections:   . Frequency of Communication with Friends and Family:   . Frequency of Social Gatherings with Friends and Family:   . Attends Religious Services:   . Active Member of Clubs or Organizations:   . Attends Banker Meetings:   Marland Kitchen Marital Status:   Intimate  Partner Violence:   . Fear of Current or Ex-Partner:   . Emotionally Abused:   Marland Kitchen Physically Abused:   . Sexually Abused:    No family history on file.  OBJECTIVE: Vitals:   03/23/20 1139  BP: (!) 135/94  Pulse: 87  Resp: 20  Temp: (!) 97.5 F (36.4 C)  SpO2: 97%    General appearance: alert; no distress Head: NCAT Lungs: clear to auscultation bilaterally Heart: regular rate and rhythm.  Radial pulse 2+ bilaterally Extremities: no edema Skin: warm and dry; Left foot with erythema, swelling, tenderness, to dorsal surface of the foot, about 1 cm diameter in area, also has 0.5cm diameter area of eschar to the middle of the area Also has open wound to left great toe, about 0.5 cm in diameter, draining yellow fluid Psychological: alert and cooperative; normal mood and affect  ASSESSMENT & PLAN:  1. Cellulitis of left lower extremity     Meds ordered this encounter  Medications  . sulfamethoxazole-trimethoprim (BACTRIM DS) 800-160 MG tablet    Sig: Take 1 tablet by mouth 2 (two) times daily for 7 days.    Dispense:  14 tablet    Refill:  0    Order Specific Question:   Supervising Provider    Answer:   Merrilee Jansky [id number]  . cefTRIAXone (ROCEPHIN) injection 500 mg    Continue antibiotic cream Rocephin in office  Prescribed Bactrim Reviewed last visit and wound was cultured with no growth CBC and CMP pending Will call with abnormal results and treat accordingly Take as prescribed and to completion Avoid hot showers/ baths Moisturize skin daily  Follow up with PCP if symptoms persists Return or go to the ER if you have any new or worsening symptoms such as fever, chills, nausea, vomiting, redness, swelling, discharge, if symptoms do not improve with medications  Reviewed expectations re: course of current medical issues. Questions answered. Outlined signs and symptoms indicating need for more acute intervention. Patient verbalized understanding. After Visit  Summary given.   Moshe Cipro, NP 03/23/20 1751    Moshe Cipro, NP 03/23/20 1752

## 2020-03-23 NOTE — ED Triage Notes (Signed)
Pt returns with some improvement in wound but area is still red and warm to touch

## 2020-03-23 NOTE — Discharge Instructions (Addendum)
I have sent in a refill of bactrim  We have given you another Rocephin injection to help further wound healing  Continue to keep the area dry, change your socks often  We are checking labs as well   We will follow up with any abnormal results and will treat as needed  Follow up with this office or with PCP as needed  Follow up with the ER for red streaking up your leg, trouble swallowing, trouble breathing, other concerning symptoms

## 2020-03-24 LAB — CBC WITH DIFFERENTIAL/PLATELET
Basophils Absolute: 0.1 10*3/uL (ref 0.0–0.2)
Basos: 1 %
EOS (ABSOLUTE): 0.3 10*3/uL (ref 0.0–0.4)
Eos: 5 %
Hematocrit: 46.3 % (ref 37.5–51.0)
Hemoglobin: 15.5 g/dL (ref 13.0–17.7)
Immature Grans (Abs): 0 10*3/uL (ref 0.0–0.1)
Immature Granulocytes: 0 %
Lymphocytes Absolute: 2.2 10*3/uL (ref 0.7–3.1)
Lymphs: 39 %
MCH: 30 pg (ref 26.6–33.0)
MCHC: 33.5 g/dL (ref 31.5–35.7)
MCV: 90 fL (ref 79–97)
Monocytes Absolute: 0.8 10*3/uL (ref 0.1–0.9)
Monocytes: 15 %
Neutrophils Absolute: 2.2 10*3/uL (ref 1.4–7.0)
Neutrophils: 40 %
Platelets: 202 10*3/uL (ref 150–450)
RBC: 5.16 x10E6/uL (ref 4.14–5.80)
RDW: 12.8 % (ref 11.6–15.4)
WBC: 5.6 10*3/uL (ref 3.4–10.8)

## 2020-03-24 LAB — BASIC METABOLIC PANEL
BUN/Creatinine Ratio: 13 (ref 9–20)
BUN: 13 mg/dL (ref 6–24)
CO2: 28 mmol/L (ref 20–29)
Calcium: 9.6 mg/dL (ref 8.7–10.2)
Chloride: 97 mmol/L (ref 96–106)
Creatinine, Ser: 0.98 mg/dL (ref 0.76–1.27)
GFR calc Af Amer: 110 mL/min/{1.73_m2} (ref >59)
GFR calc non Af Amer: 95 mL/min/{1.73_m2} (ref >59)
Glucose: 110 mg/dL — ABNORMAL HIGH (ref 65–99)
Potassium: 5 mmol/L (ref 3.5–5.2)
Sodium: 139 mmol/L (ref 134–144)
